# Patient Record
Sex: Male | Born: 1955
Health system: Southern US, Community
[De-identification: ages and names within clinical notes are randomized; demographics above are authoritative.]

## PROBLEM LIST (undated history)

## (undated) DIAGNOSIS — Z85820 Personal history of malignant melanoma of skin: Secondary | ICD-10-CM

## (undated) DIAGNOSIS — C61 Malignant neoplasm of prostate: Secondary | ICD-10-CM

## (undated) DIAGNOSIS — F5104 Psychophysiologic insomnia: Secondary | ICD-10-CM

## (undated) DIAGNOSIS — K219 Gastro-esophageal reflux disease without esophagitis: Secondary | ICD-10-CM

## (undated) DIAGNOSIS — T884XXA Failed or difficult intubation, initial encounter: Secondary | ICD-10-CM

## (undated) DIAGNOSIS — M1990 Unspecified osteoarthritis, unspecified site: Secondary | ICD-10-CM

## (undated) DIAGNOSIS — E7800 Pure hypercholesterolemia, unspecified: Secondary | ICD-10-CM

## (undated) HISTORY — PX: TONSILLECTOMY: SUR1361

## (undated) HISTORY — DX: Malignant neoplasm of prostate: C61

## (undated) HISTORY — PX: EXCISION MASS LOWER EXTREMETIES: SHX6705

---

## 2005-01-07 ENCOUNTER — Ambulatory Visit: Payer: Self-pay | Admitting: Vascular Surgery

## 2009-05-16 ENCOUNTER — Ambulatory Visit: Payer: Self-pay | Admitting: Gastroenterology

## 2014-08-14 DIAGNOSIS — F5104 Psychophysiologic insomnia: Secondary | ICD-10-CM | POA: Insufficient documentation

## 2014-08-14 DIAGNOSIS — K219 Gastro-esophageal reflux disease without esophagitis: Secondary | ICD-10-CM | POA: Insufficient documentation

## 2014-12-12 DIAGNOSIS — E7800 Pure hypercholesterolemia, unspecified: Secondary | ICD-10-CM | POA: Insufficient documentation

## 2016-01-16 ENCOUNTER — Other Ambulatory Visit: Payer: Self-pay | Admitting: Gastroenterology

## 2016-01-16 DIAGNOSIS — R1310 Dysphagia, unspecified: Secondary | ICD-10-CM

## 2016-02-06 ENCOUNTER — Ambulatory Visit
Admission: RE | Admit: 2016-02-06 | Discharge: 2016-02-06 | Disposition: A | Discharge status: Home / Self Care | Payer: 59 | Source: Ambulatory Visit | Attending: Gastroenterology | Admitting: Gastroenterology

## 2016-02-06 DIAGNOSIS — R1310 Dysphagia, unspecified: Secondary | ICD-10-CM | POA: Insufficient documentation

## 2016-02-06 DIAGNOSIS — K224 Dyskinesia of esophagus: Secondary | ICD-10-CM | POA: Insufficient documentation

## 2016-04-10 ENCOUNTER — Encounter: Payer: Self-pay | Admitting: *Deleted

## 2016-04-13 ENCOUNTER — Encounter
Admission: RE | Disposition: A | Discharge status: Home / Self Care | Payer: Self-pay | Source: Ambulatory Visit | Attending: Gastroenterology

## 2016-04-13 ENCOUNTER — Ambulatory Visit: Payer: 59 | Admitting: Anesthesiology

## 2016-04-13 ENCOUNTER — Ambulatory Visit
Admission: RE | Admit: 2016-04-13 | Discharge: 2016-04-13 | Disposition: A | Discharge status: Home / Self Care | Payer: 59 | Source: Ambulatory Visit | Attending: Gastroenterology | Admitting: Gastroenterology

## 2016-04-13 ENCOUNTER — Encounter: Payer: Self-pay | Admitting: *Deleted

## 2016-04-13 DIAGNOSIS — K224 Dyskinesia of esophagus: Secondary | ICD-10-CM | POA: Diagnosis not present

## 2016-04-13 DIAGNOSIS — Z1211 Encounter for screening for malignant neoplasm of colon: Secondary | ICD-10-CM | POA: Insufficient documentation

## 2016-04-13 DIAGNOSIS — Z880 Allergy status to penicillin: Secondary | ICD-10-CM | POA: Diagnosis not present

## 2016-04-13 DIAGNOSIS — Z79899 Other long term (current) drug therapy: Secondary | ICD-10-CM | POA: Insufficient documentation

## 2016-04-13 DIAGNOSIS — D123 Benign neoplasm of transverse colon: Secondary | ICD-10-CM | POA: Diagnosis not present

## 2016-04-13 DIAGNOSIS — K210 Gastro-esophageal reflux disease with esophagitis: Secondary | ICD-10-CM | POA: Diagnosis not present

## 2016-04-13 DIAGNOSIS — E7800 Pure hypercholesterolemia, unspecified: Secondary | ICD-10-CM | POA: Insufficient documentation

## 2016-04-13 DIAGNOSIS — Z881 Allergy status to other antibiotic agents status: Secondary | ICD-10-CM | POA: Diagnosis not present

## 2016-04-13 DIAGNOSIS — D125 Benign neoplasm of sigmoid colon: Secondary | ICD-10-CM | POA: Diagnosis not present

## 2016-04-13 DIAGNOSIS — Z85820 Personal history of malignant melanoma of skin: Secondary | ICD-10-CM | POA: Diagnosis not present

## 2016-04-13 DIAGNOSIS — Z86010 Personal history of colonic polyps: Secondary | ICD-10-CM | POA: Insufficient documentation

## 2016-04-13 DIAGNOSIS — K648 Other hemorrhoids: Secondary | ICD-10-CM | POA: Diagnosis not present

## 2016-04-13 DIAGNOSIS — K2950 Unspecified chronic gastritis without bleeding: Secondary | ICD-10-CM | POA: Insufficient documentation

## 2016-04-13 HISTORY — DX: Gastro-esophageal reflux disease without esophagitis: K21.9

## 2016-04-13 HISTORY — PX: COLONOSCOPY WITH PROPOFOL: SHX5780

## 2016-04-13 HISTORY — DX: Pure hypercholesterolemia, unspecified: E78.00

## 2016-04-13 HISTORY — DX: Psychophysiologic insomnia: F51.04

## 2016-04-13 HISTORY — DX: Personal history of malignant melanoma of skin: Z85.820

## 2016-04-13 HISTORY — PX: ESOPHAGOGASTRODUODENOSCOPY (EGD) WITH PROPOFOL: SHX5813

## 2016-04-13 SURGERY — ESOPHAGOGASTRODUODENOSCOPY (EGD) WITH PROPOFOL
Anesthesia: General

## 2016-04-13 MED ORDER — FENTANYL CITRATE (PF) 100 MCG/2ML IJ SOLN
INTRAMUSCULAR | Status: DC | PRN
  Administered 2016-04-13: 50 ug via INTRAVENOUS

## 2016-04-13 MED ORDER — PHENYLEPHRINE HCL 10 MG/ML IJ SOLN
INTRAMUSCULAR | Status: DC | PRN
  Administered 2016-04-13: 100 ug via INTRAVENOUS

## 2016-04-13 MED ORDER — PROPOFOL 500 MG/50ML IV EMUL
INTRAVENOUS | Status: DC | PRN
  Administered 2016-04-13 (×2): 150 ug/kg/min via INTRAVENOUS

## 2016-04-13 MED ORDER — MIDAZOLAM HCL 2 MG/2ML IJ SOLN
INTRAMUSCULAR | Status: DC | PRN
  Administered 2016-04-13: 1 mg via INTRAVENOUS

## 2016-04-13 MED ORDER — PROPOFOL 10 MG/ML IV BOLUS
INTRAVENOUS | Status: DC | PRN
  Administered 2016-04-13: 50 mg via INTRAVENOUS
  Administered 2016-04-13: 40 mg via INTRAVENOUS

## 2016-04-13 MED ORDER — SODIUM CHLORIDE 0.9 % IV SOLN: INTRAVENOUS | Status: DC

## 2016-04-13 MED ORDER — SODIUM CHLORIDE 0.9 % IV SOLN
INTRAVENOUS | Status: DC
  Administered 2016-04-13 (×2): via INTRAVENOUS

## 2016-04-13 NOTE — Anesthesia Procedure Notes (Signed)
Date/Time: 04/13/2016 3:54 PM Performed by: Nelda Marseille Pre-anesthesia Checklist: Patient identified, Emergency Drugs available, Suction available, Patient being monitored and Timeout performed

## 2016-04-13 NOTE — Anesthesia Preprocedure Evaluation (Signed)
Anesthesia Evaluation   Patient awake    Reviewed: Allergy & Precautions, NPO status , Patient's Chart, lab work & pertinent test results  History of Anesthesia Complications Negative for: history of anesthetic complications  Airway Mallampati: II  TM Distance: >3 FB Neck ROM: Full    Dental no notable dental hx.    Pulmonary neg pulmonary ROS, neg sleep apnea, neg COPD,    breath sounds clear to auscultation- rhonchi (-) wheezing      Cardiovascular Exercise Tolerance: Good (-) hypertension(-) CAD and (-) Past MI  Rhythm:Regular Rate:Normal - Systolic murmurs and - Diastolic murmurs    Neuro/Psych negative neurological ROS  negative psych ROS   GI/Hepatic Neg liver ROS, GERD  ,  Endo/Other  negative endocrine ROSneg diabetes  Renal/GU negative Renal ROS     Musculoskeletal negative musculoskeletal ROS (+)   Abdominal (+) - obese,   Peds  Hematology negative hematology ROS (+)   Anesthesia Other Findings Past Medical History: No date: Chronic insomnia No date: GERD (gastroesophageal reflux disease) No date: H/O Malignant melanoma     Comment: LEFT LEG No date: Hypercholesteremia   Reproductive/Obstetrics                             Anesthesia Physical Anesthesia Plan  ASA: II  Anesthesia Plan: General   Post-op Pain Management:    Induction: Intravenous  Airway Management Planned: Natural Airway  Additional Equipment:   Intra-op Plan:   Post-operative Plan:   Informed Consent: I have reviewed the patients History and Physical, chart, labs and discussed the procedure including the risks, benefits and alternatives for the proposed anesthesia with the patient or authorized representative who has indicated his/her understanding and acceptance.   Dental advisory given  Plan Discussed with: CRNA and Anesthesiologist  Anesthesia Plan Comments:         Anesthesia  Quick Evaluation

## 2016-04-13 NOTE — Op Note (Signed)
[facility] Gastroenterology Patient Name: David Kirby Procedure Date: 04/13/2016 3:09 PM MRN: [id number] Account #: [id number] Date of Birth: Dec 01, 1955 Admit Type: Outpatient Age: 60 Room: [facility] ENDO ROOM 3 Gender: Male Note Status: Finalized Procedure:            Upper GI endoscopy Indications:          Dysphagia Providers:            Lollie Sails, MD Referring MD:         Christena Flake. Raechel Ache, MD (Referring MD) Medicines:            Monitored Anesthesia Care Complications:        No immediate complications. Procedure:            Pre-Anesthesia Assessment:                       - ASA Grade Assessment: II - A patient with mild                        systemic disease.                       After obtaining informed consent, the endoscope was                        passed under direct vision. Throughout the procedure,                        the patient's blood pressure, pulse, and oxygen                        saturations were monitored continuously. The Endoscope                        was introduced through the mouth, and advanced to the                        third part of duodenum. The upper GI endoscopy was                        accomplished without difficulty. The patient tolerated                        the procedure well. Findings:      Abnormal motility was noted in the esophagus. The cricopharyngeus was       normal. There is intermittant ringed "feline" appearance. of the       esophageal body. Tertiary peristaltic waves are noted. Biopsies were       taken from about 30 cm from the incisors.      The Z-line was variable. Biopsies were taken with a cold forceps for       histology.      The exam of the esophagus was otherwise normal.      Patchy mild inflammation characterized by congestion (edema), erythema       and granularity was found in the gastric antrum. Biopsies were taken       with a cold forceps for histology.      Patchy minimal  inflammation characterized by erythema was found in the       gastric body.      The examined  duodenum was normal.      The cardia and gastric fundus were normal on retroflexion. Impression:           - Abnormal esophageal motility.                       - Z-line variable. Biopsied.                       - Gastritis. Biopsied.                       - Gastritis.                       - Normal examined duodenum. Recommendation:       - Await pathology results.                       - Use Prilosec (omeprazole) 20 mg PO daily daily. Procedure Code(s):    --- Professional ---                       [phone], Esophagogastroduodenoscopy, flexible, transoral;                        with biopsy, single or multiple Diagnosis Code(s):    --- Professional ---                       K22.4, Dyskinesia of esophagus                       K22.8, Other specified diseases of esophagus                       K29.70, Gastritis, unspecified, without bleeding                       R13.10, Dysphagia, unspecified CPT copyright 2016 American Medical Association. All rights reserved. The codes documented in this report are preliminary and upon coder review may  be revised to meet current compliance requirements. Lollie Sails, MD 04/13/2016 3:35:14 PM This report has been signed electronically. Number of Addenda: 0 Note Initiated On: 04/13/2016 3:09 PM      [facility]

## 2016-04-13 NOTE — Transfer of Care (Signed)
Immediate Anesthesia Transfer of Care Note  Patient: David Kirby  Procedure(s) Performed: Procedure(s): ESOPHAGOGASTRODUODENOSCOPY (EGD) WITH PROPOFOL (N/A) COLONOSCOPY WITH PROPOFOL (N/A)  Patient Location: PACU  Anesthesia Type:General  Level of Consciousness: sedated  Airway & Oxygen Therapy: Patient Spontanous Breathing and Patient connected to nasal cannula oxygen  Post-op Assessment: Report given to RN and Post -op Vital signs reviewed and stable  Post vital signs: Reviewed and stable  Last Vitals:  Vitals:   04/13/16 1323  BP: (!) 144/82  Pulse: 65  Resp: 16  Temp: 36.7 C    Last Pain:  Vitals:   04/13/16 1323  TempSrc: Tympanic         Complications: No apparent anesthesia complications

## 2016-04-13 NOTE — Op Note (Signed)
[facility] Gastroenterology Patient Name: David Kirby Procedure Date: 04/13/2016 3:08 PM MRN: [id number] Account #: [id number] Date of Birth: 04-29-1956 Admit Type: Outpatient Age: 60 Room: [facility] ENDO ROOM 3 Gender: Male Note Status: Finalized Procedure:            Colonoscopy Indications:          Personal history of colonic polyps Providers:            Lollie Sails, MD Referring MD:         Christena Flake. Raechel Ache, MD (Referring MD) Medicines:            Monitored Anesthesia Care Complications:        No immediate complications. Procedure:            Pre-Anesthesia Assessment:                       - ASA Grade Assessment: II - A patient with mild                        systemic disease.                       After obtaining informed consent, the colonoscope was                        passed under direct vision. Throughout the procedure,                        the patient's blood pressure, pulse, and oxygen                        saturations were monitored continuously. The                        Colonoscope was introduced through the anus and                        advanced to the the cecum, identified by appendiceal                        orifice and ileocecal valve. The colonoscopy was                        unusually difficult due to significant looping and a                        tortuous colon. Successful completion of the procedure                        was aided by changing the patient to a supine position,                        changing the patient to a prone position and using                        manual pressure. The quality of the bowel preparation                        was fair. Findings:      A single non-bleeding erosion was found in the sigmoid  colon. No       stigmata of recent bleeding were seen. Biopsies were taken with a cold       forceps for histology.      A 4 mm polyp was found in the splenic flexure. The polyp was sessile.   The polyp was removed with a cold snare. Resection and retrieval were       complete.      A 8 mm polyp was found in the hepatic flexure. The polyp was sessile.       The polyp was removed with a hot snare. Resection and retrieval were       complete.      A 3 mm polyp was found in the proximal transverse colon. The polyp was       sessile. The polyp was removed with a cold biopsy forceps. Resection and       retrieval were complete.      A localized area of granular mucosa was found in the cecumacross from       the IC valve. Biopsies were taken with a cold forceps for histology.      A 2 mm polyp was found in the proximal sigmoid colon. The polyp was       sessile. The polyp was removed with a cold biopsy forceps. Resection and       retrieval were complete.      A 4 mm polyp was found in the distal sigmoid colon. The polyp was       pedunculated. The polyp was removed with a hot snare. Resection and       retrieval were complete.      The digital rectal exam was normal. Impression:           - Preparation of the colon was fair.                       - A single erosion in the sigmoid colon. Biopsied.                       - One 4 mm polyp at the splenic flexure, removed with a                        cold snare. Resected and retrieved.                       - One 8 mm polyp at the hepatic flexure, removed with a                        hot snare. Resected and retrieved.                       - One 3 mm polyp in the proximal transverse colon,                        removed with a cold biopsy forceps. Resected and                        retrieved.                       - Granularity in the cecum. Biopsied.                       -  One 2 mm polyp in the proximal sigmoid colon, removed                        with a cold biopsy forceps. Resected and retrieved.                       - One 4 mm polyp in the distal sigmoid colon, removed                        with a hot snare. Resected and  retrieved. Recommendation:       - Discharge patient to home.                       - Await pathology results. Procedure Code(s):    --- Professional ---                       [phone], Colonoscopy, flexible; with removal of tumor(s),                        polyp(s), or other lesion(s) by snare technique                       [id number], 44, Colonoscopy, flexible; with biopsy, single                        or multiple Diagnosis Code(s):    --- Professional ---                       K63.3, Ulcer of intestine                       D12.3, Benign neoplasm of transverse colon (hepatic                        flexure or splenic flexure)                       D12.5, Benign neoplasm of sigmoid colon                       K63.89, Other specified diseases of intestine                       Z86.010, Personal history of colonic polyps CPT copyright 2016 American Medical Association. All rights reserved. The codes documented in this report are preliminary and upon coder review may  be revised to meet current compliance requirements. Lollie Sails, MD 04/13/2016 4:30:32 PM This report has been signed electronically. Number of Addenda: 0 Note Initiated On: 04/13/2016 3:08 PM Scope Withdrawal Time: 0 hours 13 minutes 34 seconds  Total Procedure Duration: 0 hours 42 minutes 28 seconds       [facility]

## 2016-04-13 NOTE — H&P (Signed)
Outpatient short stay form Pre-procedure 04/13/2016 2:53 PM Lollie Sails MD  Primary Physician: Dr. Genene Churn  Reason for visit:  EGD and colonoscopy  History of present illness:  Patient is a 60 year old male presenting today as above. Has a personal history of adenomatous colon polyps. Also he states that he is been having some reflux type symptoms. He has no actual dysphagia. He did have a barium swallow which showed a normal transit of the 13 mm tablet. There were some intermittent distal esophageal spasm otherwise normal findings. He tolerated his prep well. He takes no aspirin or blood thinning agents.  He states he started on some omeprazole daily and this seems to have helped much of his reflux issue.    Current Facility-Administered Medications:  .  0.9 %  sodium chloride infusion, , Intravenous, Continuous, Lollie Sails, MD, Last Rate: 20 mL/hr at 04/13/16 1336 .  0.9 %  sodium chloride infusion, , Intravenous, Continuous, Lollie Sails, MD  Prescriptions Prior to Admission  Medication Sig Dispense Refill Last Dose  . cetirizine (ZYRTEC) 10 MG tablet Take 10 mg by mouth daily.   Past Week at Unknown time  . ibuprofen (ADVIL,MOTRIN) 200 MG tablet Take 200 mg by mouth 2 (two) times daily.   Past Week at Unknown time  . Melatonin 3 MG TABS Take 3 mg by mouth at bedtime as needed.   Past Week at Unknown time  . Multiple Vitamin (MULTIVITAMIN) tablet Take 1 tablet by mouth daily.   Past Week at Unknown time  . omeprazole (PRILOSEC) 20 MG capsule Take 20 mg by mouth daily.   Past Week at Unknown time  . polyethylene glycol powder (GLYCOLAX/MIRALAX) powder Take 1 Container by mouth once.   Completed Course at Unknown time  . ranitidine (ZANTAC) 75 MG tablet Take 75 mg by mouth 2 (two) times daily.   Completed Course at Unknown time     Allergies  Allergen Reactions  . Biaxin [Clarithromycin] Other (See Comments)    G.I.UPSET   . Penicillins Other (See Comments)    G.I.UPSET   . Tetracyclines & Related Swelling     Past Medical History:  Diagnosis Date  . Chronic insomnia   . GERD (gastroesophageal reflux disease)   . H/O Malignant melanoma    LEFT LEG  . Hypercholesteremia     Review of systems:      Physical Exam    Heart and lungs: Regular rate and rhythm without rub or gallop, lungs are bilaterally clear.    HEENT: Normocephalic atraumatic eyes are anicteric    Other:     Pertinant exam for procedure: Soft nontender nondistended bowel sounds positive normoactive.    Planned proceedures: EGD, colonoscopy and indicated procedures. I have discussed the risks benefits and complications of procedures to include not limited to bleeding, infection, perforation and the risk of sedation and the patient wishes to proceed.    Lollie Sails, MD Gastroenterology 04/13/2016  2:53 PM

## 2016-04-14 ENCOUNTER — Encounter: Payer: Self-pay | Admitting: Gastroenterology

## 2016-04-14 NOTE — Anesthesia Postprocedure Evaluation (Signed)
Anesthesia Post Note  Patient: David Kirby  Procedure(s) Performed: Procedure(s) (LRB): ESOPHAGOGASTRODUODENOSCOPY (EGD) WITH PROPOFOL (N/A) COLONOSCOPY WITH PROPOFOL (N/A)  Patient location during evaluation: Endoscopy Anesthesia Type: General Level of consciousness: awake and alert Pain management: pain level controlled Vital Signs Assessment: post-procedure vital signs reviewed and stable Respiratory status: spontaneous breathing, nonlabored ventilation, respiratory function stable and patient connected to nasal cannula oxygen Cardiovascular status: blood pressure returned to baseline and stable Postop Assessment: no signs of nausea or vomiting Anesthetic complications: no    Last Vitals:  Vitals:   04/13/16 1648 04/13/16 1658  BP: 123/77 140/80  Pulse: 62 (!) 58  Resp: 13 14  Temp:      Last Pain:  Vitals:   04/13/16 1628  TempSrc: Axillary  PainSc: Asleep                 Martha Clan

## 2016-04-15 LAB — SURGICAL PATHOLOGY

## 2016-12-17 DIAGNOSIS — Z0000 Encounter for general adult medical examination without abnormal findings: Secondary | ICD-10-CM | POA: Diagnosis not present

## 2016-12-17 DIAGNOSIS — I8393 Asymptomatic varicose veins of bilateral lower extremities: Secondary | ICD-10-CM | POA: Insufficient documentation

## 2016-12-17 DIAGNOSIS — Z86010 Personal history of colonic polyps: Secondary | ICD-10-CM | POA: Insufficient documentation

## 2016-12-17 DIAGNOSIS — E7800 Pure hypercholesterolemia, unspecified: Secondary | ICD-10-CM | POA: Diagnosis not present

## 2017-04-30 DIAGNOSIS — Z23 Encounter for immunization: Secondary | ICD-10-CM | POA: Diagnosis not present

## 2017-12-21 DIAGNOSIS — I83893 Varicose veins of bilateral lower extremities with other complications: Secondary | ICD-10-CM | POA: Diagnosis not present

## 2017-12-21 DIAGNOSIS — Z79899 Other long term (current) drug therapy: Secondary | ICD-10-CM | POA: Diagnosis not present

## 2017-12-21 DIAGNOSIS — R030 Elevated blood-pressure reading, without diagnosis of hypertension: Secondary | ICD-10-CM | POA: Diagnosis not present

## 2017-12-21 DIAGNOSIS — Z0000 Encounter for general adult medical examination without abnormal findings: Secondary | ICD-10-CM | POA: Diagnosis not present

## 2017-12-21 DIAGNOSIS — E7800 Pure hypercholesterolemia, unspecified: Secondary | ICD-10-CM | POA: Diagnosis not present

## 2017-12-21 DIAGNOSIS — Z125 Encounter for screening for malignant neoplasm of prostate: Secondary | ICD-10-CM | POA: Diagnosis not present

## 2017-12-21 DIAGNOSIS — R238 Other skin changes: Secondary | ICD-10-CM | POA: Diagnosis not present

## 2017-12-22 DIAGNOSIS — R972 Elevated prostate specific antigen [PSA]: Secondary | ICD-10-CM | POA: Insufficient documentation

## 2018-02-11 ENCOUNTER — Ambulatory Visit (INDEPENDENT_AMBULATORY_CARE_PROVIDER_SITE_OTHER): Payer: 59 | Admitting: Urology

## 2018-02-11 ENCOUNTER — Encounter: Payer: Self-pay | Admitting: Urology

## 2018-02-11 ENCOUNTER — Other Ambulatory Visit
Admission: RE | Admit: 2018-02-11 | Discharge: 2018-02-11 | Disposition: A | Discharge status: Home / Self Care | Payer: 59 | Source: Ambulatory Visit | Attending: Urology | Admitting: Urology

## 2018-02-11 VITALS — BP 156/86 | HR 62 | Ht 69.0 in | Wt 190.0 lb

## 2018-02-11 DIAGNOSIS — N400 Enlarged prostate without lower urinary tract symptoms: Secondary | ICD-10-CM

## 2018-02-11 DIAGNOSIS — R972 Elevated prostate specific antigen [PSA]: Secondary | ICD-10-CM | POA: Insufficient documentation

## 2018-02-11 NOTE — Progress Notes (Signed)
02/11/2018 2:56 PM   David Kirby Jul 05, 1956 [id number]  Referring provider: [name], MD [facility] [address], [place] [address]  Chief Complaint  Patient presents with  . Elevated PSA    New Patient    HPI: 62 year old male referred for evaluation of elevated PSA.  He underwent routine annual PSA screening by his primary care physician on 12/21/2017 at which time his PSA was noted to be 5.48.  This is up significantly from 2 years ago at which time his PSA was 2.82.  Most recent urinalysis on 12/21/2017 negative.     He denies any urinary symptoms including frequency, urgency, nocturia, weak stream, incomplete bladder emptying, hematuria, or dysuria.  He is completely symptom medic.  He does report that he was told by his previous primary care physician that he has an enlarged prostate.  He is never seen a urologist before.  He takes no BPH meds.  No family history of prostate cancer.  No weight loss or bone pain.    PMH: Past Medical History:  Diagnosis Date  . Chronic insomnia   . GERD (gastroesophageal reflux disease)   . H/O Malignant melanoma    LEFT LEG  . Hypercholesteremia     Surgical History: Past Surgical History:  Procedure Laterality Date  . COLONOSCOPY WITH PROPOFOL N/A 04/13/2016   Procedure: COLONOSCOPY WITH PROPOFOL;  Surgeon: Lollie Sails, MD;  [facility] ENDOSCOPY;  Service: Endoscopy;  Laterality: N/A;  . ESOPHAGOGASTRODUODENOSCOPY (EGD) WITH PROPOFOL N/A 04/13/2016   Procedure: ESOPHAGOGASTRODUODENOSCOPY (EGD) WITH PROPOFOL;  Surgeon: Lollie Sails, MD;  Location: [facility] ENDOSCOPY;  Service: Endoscopy;  Laterality: N/A;  . EXCISION MASS LOWER EXTREMETIES Left    EXCISION MALIGNANT MELANOMA  . TONSILLECTOMY     W/ADENOIDECTOMY    Home Medications:  Allergies as of 02/11/2018      Reactions   Biaxin [clarithromycin] Other (See Comments)   G.I.UPSET   Penicillins Other (See Comments)   G.I.UPSET   Tetracyclines & Related Swelling      Medication List        Accurate as of 02/11/18  2:56 PM. Always use your most recent med list.          Melatonin 1 MG Caps Take by mouth.   omeprazole 20 MG capsule Commonly known as:  PRILOSEC Take 20 mg by mouth daily.       Allergies:  Allergies  Allergen Reactions  . Biaxin [Clarithromycin] Other (See Comments)    G.I.UPSET   . Penicillins Other (See Comments)    G.I.UPSET   . Tetracyclines & Related Swelling    Family History: No family history on file.  Social History:  reports that he has never smoked. He has never used smokeless tobacco. He reports that he drinks about 1.2 oz of alcohol per week. He reports that he does not use drugs.  ROS: UROLOGY Frequent Urination?: No Hard to postpone urination?: No Burning/pain with urination?: No Get up at night to urinate?: Yes Leakage of urine?: No Urine stream starts and stops?: No Trouble starting stream?: No Do you have to strain to urinate?: No Blood in urine?: No Urinary tract infection?: No Sexually transmitted disease?: No Injury to kidneys or bladder?: No Painful intercourse?: No Weak stream?: No Erection problems?: No Penile pain?: No  Gastrointestinal Nausea?: No Vomiting?: No Indigestion/heartburn?: Yes Diarrhea?: No Constipation?: No  Constitutional Fever: No Night sweats?: No Weight loss?: No Fatigue?: No  Skin Skin rash/lesions?: No Itching?: No  Eyes Blurred vision?: No Double vision?: No  Ears/Nose/Throat Sore throat?: No Sinus problems?: Yes  Hematologic/Lymphatic Swollen glands?: No Easy bruising?: Yes  Cardiovascular Leg swelling?: Yes Chest pain?: No  Respiratory Cough?: No Shortness of breath?: No  Endocrine Excessive thirst?: No  Musculoskeletal Back pain?: No Joint pain?: No  Neurological Headaches?: No Dizziness?: No  Psychologic Depression?: No Anxiety?: No  Physical Exam: BP (!) 156/86   Pulse  62   Ht 5\' 9"  (1.753 m)   Wt 190 lb (86.2 kg)   BMI 28.06 kg/m   Constitutional:  Alert and oriented, No acute distress. HEENT: [place] AT, moist mucus membranes.  Trachea midline, no masses. Cardiovascular: No clubbing, cyanosis, or edema. Respiratory: Normal respiratory effort, no increased work of breathing. GI: Abdomen is soft, nontender, nondistended, no abdominal masses GU: No CVA tenderness Rectal: Normal sphincter tone, 50+ cc prostate, non tender, no nodule Skin: + bilateral varicose veins notable on feet Neurologic: Grossly intact, no focal deficits, moving all 4 extremities. Psychiatric: Normal mood and affect.  Laboratory Data: Labs from 12/21/2017 reviewed Hemoglobin 15.4 Creatinine 0.8  Urinalysis UA reviewed, negative  Pertinent Imaging: N/A   Assessment & Plan:    1. Elevated PSA  We reviewed the implications of an elevated PSA and the uncertainty surrounding it. In general, a man's PSA increases with age and is produced by both normal and cancerous prostate tissue. Differential for elevated PSA is BPH, prostate cancer, infection, recent intercourse/ejaculation, prostate infarction, recent urethroscopic manipulation (foley placement/cystoscopy) and prostatitis. Management of an elevated PSA can include observation or prostate biopsy and wediscussed this in detail.  We discussed that indications for prostate biopsy are defined by age and race specific PSA cutoffs as well as a PSA velocity of 0.75/year.  I recommended repeat PSA today.  If his PSA is down, will likely follow him closely.  Otherwise, we did go ahead and discuss prostate biopsy.  We discussed prostate biopsy in detail including the procedure itself, the risks of blood in the urine, stool, and ejaculate, serious infection, and discomfort. He is willing to proceed with this as discussed.  Call him with his PSA results as well as follow-up recommendations based on this repeat value.  He is agreeable this  plan.  2.  BPH without lower urinary tract symptoms Prostamegaly on exam but otherwise asymptomatic  - PSA; Future  Hollice Espy, MD  [facility] [address], [place], [place] [address] [phone]

## 2018-02-12 LAB — PSA: PROSTATIC SPECIFIC ANTIGEN: 5.01 ng/mL — AB (ref 0.00–4.00)

## 2018-02-14 ENCOUNTER — Telehealth: Payer: Self-pay

## 2018-02-14 NOTE — Telephone Encounter (Signed)
-----   Message from Hollice Espy, MD sent at 02/14/2018  9:19 AM EDT ----- PSA is still up to 5.01 which is higher than would be expected for his age, however the downward trend is somewhat reassuring.  Like to recheck his PSA in 4 months (PSA followed by visit) and keep a close eye on it.  If remains elevated, will consider biopsy vs. MRI.  Hollice Espy, MD

## 2018-02-14 NOTE — Telephone Encounter (Signed)
Message sent via mychart. Please scheudle appointment for psa and follow-up in 4 months.

## 2018-02-15 ENCOUNTER — Encounter (INDEPENDENT_AMBULATORY_CARE_PROVIDER_SITE_OTHER): Payer: Self-pay

## 2018-02-15 NOTE — Telephone Encounter (Signed)
apps made and patient was notified via mychart.  Sharyn Lull

## 2018-05-01 DIAGNOSIS — Z23 Encounter for immunization: Secondary | ICD-10-CM | POA: Diagnosis not present

## 2018-06-02 DIAGNOSIS — L918 Other hypertrophic disorders of the skin: Secondary | ICD-10-CM | POA: Diagnosis not present

## 2018-06-13 ENCOUNTER — Other Ambulatory Visit: Payer: Self-pay

## 2018-06-13 DIAGNOSIS — N400 Enlarged prostate without lower urinary tract symptoms: Secondary | ICD-10-CM

## 2018-06-13 DIAGNOSIS — R972 Elevated prostate specific antigen [PSA]: Secondary | ICD-10-CM

## 2018-06-15 ENCOUNTER — Other Ambulatory Visit: Payer: 59

## 2018-06-15 DIAGNOSIS — R972 Elevated prostate specific antigen [PSA]: Secondary | ICD-10-CM

## 2018-06-15 DIAGNOSIS — N400 Enlarged prostate without lower urinary tract symptoms: Secondary | ICD-10-CM

## 2018-06-16 LAB — PSA: PROSTATE SPECIFIC AG, SERUM: 4.4 ng/mL — AB (ref 0.0–4.0)

## 2018-06-21 NOTE — Progress Notes (Signed)
06/22/2018  10:46 AM   David Kirby [birth year] [id number]  Referring provider: [name], MD [facility] [place], [place] [address]  Chief Complaint  Patient presents with  . Elevated PSA    4 month    HPI: David Kirby is a 62 yo M who returns today for the evaluation and management of elevated PSA and BPH w/o LUTS. His last visit with [place] was on 02/11/18.   He denies any urinary symptoms including frequency, urgency, nocturia, weak stream, incomplete bladder emptying, hematuria, or dysuria.  He is completely symptom medic.  PSA trend below.   Background History:  He reported that he was told by his previous primary care physician that he had an enlarged prostate.  He had never seen a urologist before.  He reported of not being BPH meds. No family history of prostate cancer. No weight loss or bone pain.  PSA Trend 06/15/18, 4.4  02/11/18, 5.01 12/21/2017, 5.48  12/2015, 2.82   PMH: Past Medical History:  Diagnosis Date  . Chronic insomnia   . GERD (gastroesophageal reflux disease)   . H/O Malignant melanoma    LEFT LEG  . Hypercholesteremia     Surgical History: Past Surgical History:  Procedure Laterality Date  . COLONOSCOPY WITH PROPOFOL N/A 04/13/2016   Procedure: COLONOSCOPY WITH PROPOFOL;  Surgeon: Lollie Sails, MD;  Location: [facility] ENDOSCOPY;  Service: Endoscopy;  Laterality: N/A;  . ESOPHAGOGASTRODUODENOSCOPY (EGD) WITH PROPOFOL N/A 04/13/2016   Procedure: ESOPHAGOGASTRODUODENOSCOPY (EGD) WITH PROPOFOL;  Surgeon: Lollie Sails, MD;  Location: [facility] ENDOSCOPY;  Service: Endoscopy;  Laterality: N/A;  . EXCISION MASS LOWER EXTREMETIES Left    EXCISION MALIGNANT MELANOMA  . TONSILLECTOMY     W/ADENOIDECTOMY    Home Medications:  Allergies as of 06/22/2018      Reactions   Biaxin [clarithromycin] Other (See Comments)   G.I.UPSET   Penicillins Other (See Comments)   G.I.UPSET   Tetracyclines & Related Swelling        Medication List        Accurate as of 06/22/18 10:46 AM. Always use your most recent med list.          Melatonin 1 MG Caps Take by mouth.   omeprazole 20 MG capsule Commonly known as:  PRILOSEC Take 20 mg by mouth daily.       Allergies:  Allergies  Allergen Reactions  . Biaxin [Clarithromycin] Other (See Comments)    G.I.UPSET   . Penicillins Other (See Comments)    G.I.UPSET   . Tetracyclines & Related Swelling    Family History: No family history on file.  Social History:  reports that he has never smoked. He has never used smokeless tobacco. He reports that he drinks about 2.0 standard drinks of alcohol per week. He reports that he does not use drugs.  ROS: UROLOGY Frequent Urination?: No Hard to postpone urination?: No Burning/pain with urination?: No Get up at night to urinate?: No Leakage of urine?: No Urine stream starts and stops?: No Trouble starting stream?: No Do you have to strain to urinate?: No Blood in urine?: No Urinary tract infection?: No Sexually transmitted disease?: No Injury to kidneys or bladder?: No Painful intercourse?: No Weak stream?: No Erection problems?: No Penile pain?: No  Gastrointestinal Nausea?: No Vomiting?: No Indigestion/heartburn?: Yes Diarrhea?: No Constipation?: No  Constitutional Fever: No Night sweats?: No Weight loss?: No Fatigue?: No  Skin Skin rash/lesions?: No Itching?: No  Eyes Blurred vision?:  No Double vision?: No  Ears/Nose/Throat Sore throat?: No Sinus problems?: No  Hematologic/Lymphatic Swollen glands?: No Easy bruising?: No  Cardiovascular Leg swelling?: No Chest pain?: No  Respiratory Cough?: No Shortness of breath?: No  Endocrine Excessive thirst?: No  Musculoskeletal Back pain?: No Joint pain?: No  Neurological Headaches?: No Dizziness?: No  Psychologic Depression?: No Anxiety?: No  Physical Exam: BP (!) 144/87   Pulse 69   Ht 5\' 9"  (1.753 m)   Wt  190 lb (86.2 kg)   BMI 28.06 kg/m   Constitutional:  Alert and oriented, No acute distress. HEENT: [place] AT, moist mucus membranes.  Trachea midline, no masses. Cardiovascular: No clubbing, cyanosis, or edema. Respiratory: Normal respiratory effort, no increased work of breathing. Skin: No rashes, bruises or suspicious lesions. Neurologic: Grossly intact, no focal deficits, moving all 4 extremities. Psychiatric: Normal mood and affect.  Laboratory Data:  PSA as above   Assessment & Plan:    1. Elevated PSA PSA trending downward but remains elevated We discussed options including continued close surveillance, biopsy vs. MRI. He is interested in continued close surveillance which seems reasonable at this point  Follow up in 4 months for lab only and then in 8 months w/ lab before and rectal exam  Low threshold to consider alternative options if PSA begins to trend back upwards  2.  BPH without lower urinary tract symptoms Prostamegaly on exam but otherwise asymptomatic   Return for f/u in 4 months for lab only, then in 8 months with lab before and rectal exam.  Hollice Espy, MD  [facility] [address] [place], [place] [address] [phone]   I, Lucas Mallow, am acting as a scribe for Dr. Hollice Espy,  I have reviewed the above documentation for accuracy and completeness, and I agree with the above.   Hollice Espy, MD

## 2018-06-22 ENCOUNTER — Encounter: Payer: Self-pay | Admitting: Urology

## 2018-06-22 ENCOUNTER — Ambulatory Visit (INDEPENDENT_AMBULATORY_CARE_PROVIDER_SITE_OTHER): Payer: 59 | Admitting: Urology

## 2018-06-22 VITALS — BP 144/87 | HR 69 | Ht 69.0 in | Wt 190.0 lb

## 2018-06-22 DIAGNOSIS — R972 Elevated prostate specific antigen [PSA]: Secondary | ICD-10-CM

## 2018-06-22 DIAGNOSIS — N400 Enlarged prostate without lower urinary tract symptoms: Secondary | ICD-10-CM

## 2018-10-27 ENCOUNTER — Other Ambulatory Visit: Payer: 59

## 2018-10-27 ENCOUNTER — Other Ambulatory Visit: Payer: Self-pay

## 2018-10-27 DIAGNOSIS — R972 Elevated prostate specific antigen [PSA]: Secondary | ICD-10-CM

## 2018-10-28 ENCOUNTER — Other Ambulatory Visit: Payer: 59

## 2018-10-28 LAB — PSA: Prostate Specific Ag, Serum: 4.3 ng/mL — ABNORMAL HIGH (ref 0.0–4.0)

## 2019-02-15 ENCOUNTER — Other Ambulatory Visit: Payer: 59

## 2019-02-24 ENCOUNTER — Ambulatory Visit: Payer: 59 | Admitting: Urology

## 2019-03-16 ENCOUNTER — Other Ambulatory Visit
Admission: RE | Admit: 2019-03-16 | Discharge: 2019-03-16 | Disposition: A | Discharge status: Home / Self Care | Payer: 59 | Attending: Urology | Admitting: Urology

## 2019-03-16 DIAGNOSIS — R972 Elevated prostate specific antigen [PSA]: Secondary | ICD-10-CM | POA: Insufficient documentation

## 2019-03-16 LAB — PSA: Prostatic Specific Antigen: 5.04 ng/mL — ABNORMAL HIGH (ref 0.00–4.00)

## 2019-03-24 ENCOUNTER — Ambulatory Visit: Payer: 59 | Admitting: Urology

## 2019-03-31 ENCOUNTER — Ambulatory Visit (INDEPENDENT_AMBULATORY_CARE_PROVIDER_SITE_OTHER): Payer: 59 | Admitting: Urology

## 2019-03-31 ENCOUNTER — Encounter: Payer: Self-pay | Admitting: Urology

## 2019-03-31 ENCOUNTER — Other Ambulatory Visit: Payer: Self-pay

## 2019-03-31 VITALS — BP 134/79 | HR 76 | Ht 69.0 in | Wt 180.0 lb

## 2019-03-31 DIAGNOSIS — R972 Elevated prostate specific antigen [PSA]: Secondary | ICD-10-CM

## 2019-03-31 DIAGNOSIS — N400 Enlarged prostate without lower urinary tract symptoms: Secondary | ICD-10-CM

## 2019-03-31 NOTE — Progress Notes (Signed)
03/31/2019 8:46 AM   David Kirby 1956/05/28 [id number]  Referring provider: [name], MD [facility] [place],  [place] [address]  Chief Complaint  Patient presents with  . Elevated PSA    90mo    HPI: 63 year old male returns today for follow-up for history of elevated PSA.  Most recently, his PSA is trending back upward to 5.07.  No previous biopsies or imaging.  No family history of prostate cancer.  Today, he has very few voiding complaints.  He gets up occasionally at night.  He feels like he empties his bladder well.  No UTIs or gross hematuria.  PSA Trend 03/16/2019, 5.04 10/27/2018, 4.3 06/15/18, 4.4  02/11/18, 5.01 12/21/2017, 5.48  12/2015, 2.82  PMH: Past Medical History:  Diagnosis Date  . Chronic insomnia   . GERD (gastroesophageal reflux disease)   . H/O Malignant melanoma    LEFT LEG  . Hypercholesteremia     Surgical History: Past Surgical History:  Procedure Laterality Date  . COLONOSCOPY WITH PROPOFOL N/A 04/13/2016   Procedure: COLONOSCOPY WITH PROPOFOL;  Surgeon: Lollie Sails, MD;  Location: [facility] ENDOSCOPY;  Service: Endoscopy;  Laterality: N/A;  . ESOPHAGOGASTRODUODENOSCOPY (EGD) WITH PROPOFOL N/A 04/13/2016   Procedure: ESOPHAGOGASTRODUODENOSCOPY (EGD) WITH PROPOFOL;  Surgeon: Lollie Sails, MD;  Location: [facility] ENDOSCOPY;  Service: Endoscopy;  Laterality: N/A;  . EXCISION MASS LOWER EXTREMETIES Left    EXCISION MALIGNANT MELANOMA  . TONSILLECTOMY     W/ADENOIDECTOMY    Home Medications:  Allergies as of 03/31/2019      Reactions   Biaxin [clarithromycin] Other (See Comments)   G.I.UPSET   Penicillins Other (See Comments)   G.I.UPSET   Tetracyclines & Related Swelling      Medication List       Accurate as of March 31, 2019 11:59 PM. If you have any questions, ask your nurse or doctor.        CENTRUM SILVER PO Take by mouth.   Melatonin 1 MG Caps Take by mouth.   omeprazole  20 MG capsule Commonly known as: PRILOSEC Take 20 mg by mouth daily.   senna-docusate 8.6-50 MG tablet Commonly known as: Senokot-S Take by mouth.       Allergies:  Allergies  Allergen Reactions  . Biaxin [Clarithromycin] Other (See Comments)    G.I.UPSET   . Penicillins Other (See Comments)    G.I.UPSET   . Tetracyclines & Related Swelling    Family History: No family history on file.  Social History:  reports that he has never smoked. He has never used smokeless tobacco. He reports current alcohol use of about 2.0 standard drinks of alcohol per week. He reports that he does not use drugs.  ROS: UROLOGY Frequent Urination?: No Hard to postpone urination?: No Burning/pain with urination?: No Get up at night to urinate?: No Leakage of urine?: No Urine stream starts and stops?: No Trouble starting stream?: No Do you have to strain to urinate?: No Blood in urine?: No Urinary tract infection?: No Sexually transmitted disease?: No Injury to kidneys or bladder?: No Painful intercourse?: No Weak stream?: No Erection problems?: No Penile pain?: No  Gastrointestinal Nausea?: No Vomiting?: No Indigestion/heartburn?: No Diarrhea?: No Constipation?: No  Constitutional Fever: No Night sweats?: No Weight loss?: No Fatigue?: No  Skin Skin rash/lesions?: No Itching?: No  Eyes Blurred vision?: No Double vision?: No  Ears/Nose/Throat Sore throat?: No Sinus problems?: No  Hematologic/Lymphatic Swollen glands?: No Easy bruising?: No  Cardiovascular  Leg swelling?: No Chest pain?: No  Respiratory Cough?: No Shortness of breath?: No  Endocrine Excessive thirst?: No  Musculoskeletal Back pain?: No Joint pain?: No  Neurological Headaches?: No Dizziness?: No  Psychologic Depression?: No Anxiety?: No  Physical Exam: BP 134/79   Pulse 76   Ht 5\' 9"  (1.753 m)   Wt 180 lb (81.6 kg)   BMI 26.58 kg/m   Constitutional:  Alert and oriented, No  acute distress. HEENT: [place] AT, moist mucus membranes.  Trachea midline, no masses. Cardiovascular: No clubbing, cyanosis, or edema. Respiratory: Normal respiratory effort, no increased work of breathing. GI: Abdomen is soft, nontender, nondistended, no abdominal masses Palpable: Normal sphincter tone.  Enlarged 50+ cc prostate, nontender, nonnodular. Skin: No rashes, bruises or suspicious lesions. Neurologic: Grossly intact, no focal deficits, moving all 4 extremities. Psychiatric: Normal mood and affect.  Laboratory Data: PSA trend as above    Assessment & Plan:    1. Elevated PSA Personal history of elevated PSA which is been as high as 5 in the past.  Most recent PSA is trending back upwards to his previous peak  This is likely appropriate for his gland size and is we will continue to hold off on prostate biopsy, however, would be more reassured that biopsy is not necessary if he has a negative imaging.  At that case, we can push out surveillance to less frequent.  Option of pursuing biopsy was also discussed.  Risk and benefits of each were discussed.  He is interested in pursuing prostate MRI.  We will call him the results.  He understands that if high-grade lesion is present, he would need a prostate biopsy versus fusion biopsy.  - PSA; Future - MR PROSTATE W WO CONTRAST; Future  2. BPH without obstruction/lower urinary tract symptoms As above - PSA; Future - MR PROSTATE W WO CONTRAST; Future   Follow-up in 1 year for PSA/DRE as long as prostate MRI is reassuring, will call with prostate MRI results  Hollice Espy, MD  [facility] [address], [place], [place] [address] [phone]

## 2019-05-08 ENCOUNTER — Other Ambulatory Visit: Payer: Self-pay

## 2019-05-08 ENCOUNTER — Ambulatory Visit
Admission: RE | Admit: 2019-05-08 | Discharge: 2019-05-08 | Disposition: A | Discharge status: Home / Self Care | Payer: 59 | Source: Ambulatory Visit | Attending: Urology | Admitting: Urology

## 2019-05-08 DIAGNOSIS — R972 Elevated prostate specific antigen [PSA]: Secondary | ICD-10-CM | POA: Insufficient documentation

## 2019-05-08 DIAGNOSIS — N400 Enlarged prostate without lower urinary tract symptoms: Secondary | ICD-10-CM | POA: Diagnosis present

## 2019-05-08 LAB — POCT I-STAT CREATININE: Creatinine, Ser: 0.8 mg/dL (ref 0.61–1.24)

## 2019-05-08 MED ORDER — GADOBUTROL 1 MMOL/ML IV SOLN
8.0000 mL | Freq: Once | INTRAVENOUS | Status: AC | PRN
  Administered 2019-05-08: 8 mL via INTRAVENOUS

## 2019-05-18 ENCOUNTER — Telehealth: Payer: Self-pay | Admitting: *Deleted

## 2019-05-18 DIAGNOSIS — R972 Elevated prostate specific antigen [PSA]: Secondary | ICD-10-CM

## 2019-05-18 NOTE — Telephone Encounter (Addendum)
Patient notified-lab appointment and follow up scheduled. Verbalized understanding.    ----- Message from Hollice Espy, MD sent at 05/18/2019  3:26 PM EDT ----- Called to discuss MRI results.  In combination with his fluctuating PSA, history, and exam findings, this result is fairly equivocal.  We discussed options including pursuing prostate MRI versus close surveillance with PSA in 6 months.  At this point, both these options seem reasonable.  He is most interested in surveillance.  Please arrange for follow-up in 6 months with me with PSA prior.  Hollice Espy, MD

## 2019-11-16 ENCOUNTER — Other Ambulatory Visit: Payer: 59

## 2019-11-16 ENCOUNTER — Other Ambulatory Visit: Payer: Self-pay

## 2019-11-16 DIAGNOSIS — R972 Elevated prostate specific antigen [PSA]: Secondary | ICD-10-CM

## 2019-11-17 LAB — PSA: Prostate Specific Ag, Serum: 6.4 ng/mL — ABNORMAL HIGH (ref 0.0–4.0)

## 2019-11-20 NOTE — Progress Notes (Signed)
11/21/19 11:15 AM   David Kirby December 21, 1955 [id number]  Referring provider: [name], MD [facility] [place],  [place] [address] Chief Complaint  Patient presents with  . Elevated PSA    HPI: David Kirby is a 64 y.o. M who returns today for 6 month f/u for the evaluation and management of elevated PSA.   Most recent PSA from 11/16/19 elevated to 6.4 from previous PSA of 5.04 on 03/16/2019.   MR of prostate w and w/o constrast revealed PI-RADS category 3 lesion of the right posterolateral peripheral zone in the apex, measuring 0.28 cubic cm and PI-RADS category 3 lesion of the right posterior peripheral zone and right posterior transition zone in the apex. Evidence of benign prostatic hypertrophy and no findings of locally advanced or pelvic metastatic disease noted.   He currently has a lot going on including his wife having a stent placed in her heart. He is concerned about potential risk of infection.  No previous biopsies or imaging.  No family history of prostate cancer.  PSA Trend 11/16/19, 6.4 03/16/2019, 5.04 10/27/2018, 4.3 06/15/18, 4.4  02/11/18, 5.01 12/21/2017, 5.48  12/2015, 2.82  PMH: Past Medical History:  Diagnosis Date  . Chronic insomnia   . GERD (gastroesophageal reflux disease)   . H/O Malignant melanoma    LEFT LEG  . Hypercholesteremia     Surgical History: Past Surgical History:  Procedure Laterality Date  . COLONOSCOPY WITH PROPOFOL N/A 04/13/2016   Procedure: COLONOSCOPY WITH PROPOFOL;  Surgeon: Lollie Sails, MD;  Location: [facility] ENDOSCOPY;  Service: Endoscopy;  Laterality: N/A;  . ESOPHAGOGASTRODUODENOSCOPY (EGD) WITH PROPOFOL N/A 04/13/2016   Procedure: ESOPHAGOGASTRODUODENOSCOPY (EGD) WITH PROPOFOL;  Surgeon: Lollie Sails, MD;  Location: [facility] ENDOSCOPY;  Service: Endoscopy;  Laterality: N/A;  . EXCISION MASS LOWER EXTREMETIES Left    EXCISION MALIGNANT MELANOMA  . TONSILLECTOMY     W/ADENOIDECTOMY    Home Medications:  Allergies as of 11/21/2019      Reactions   Biaxin [clarithromycin] Other (See Comments)   G.I.UPSET   Penicillins Other (See Comments)   G.I.UPSET   Tetracyclines & Related Swelling      Medication List       Accurate as of Nov 21, 2019 11:15 AM. If you have any questions, ask your nurse or doctor.        CENTRUM SILVER PO Take by mouth.   Melatonin 1 MG Caps Take by mouth.   omeprazole 20 MG capsule Commonly known as: PRILOSEC Take 20 mg by mouth daily.   senna-docusate 8.6-50 MG tablet Commonly known as: Senokot-S Take by mouth.       Allergies:  Allergies  Allergen Reactions  . Biaxin [Clarithromycin] Other (See Comments)    G.I.UPSET   . Penicillins Other (See Comments)    G.I.UPSET   . Tetracyclines & Related Swelling    Family History: No family history on file.  Social History:  reports that he has never smoked. He has never used smokeless tobacco. He reports current alcohol use of about 2.0 standard drinks of alcohol per week. He reports that he does not use drugs.   Physical Exam: BP (!) 144/89   Pulse (!) 57   Ht 5\' 9"  (1.753 m)   Wt 181 lb (82.1 kg)   BMI 26.73 kg/m   Constitutional:  Alert and oriented, No acute distress. HEENT: [place] AT, moist mucus membranes.  Trachea midline, no masses. Cardiovascular: No clubbing, cyanosis, or edema.  Respiratory: Normal respiratory effort, no increased work of breathing. Skin: No rashes, bruises or suspicious lesions. Neurologic: Grossly intact, no focal deficits, moving all 4 extremities. Psychiatric: Normal mood and affect.  Pertinent Imaging: CLINICAL DATA:  Elevated PSA level of 5.0 in July 2020.  EXAM: MR PROSTATE WITHOUT AND WITH CONTRAST  TECHNIQUE: Multiplanar multisequence MRI images were obtained of the pelvis centered about the prostate. Pre and post contrast images were obtained.  CONTRAST:  42mL GADAVIST GADOBUTROL 1 MMOL/ML IV  SOLN  COMPARISON:  None.  FINDINGS: Prostate:  Region of interest # 1: PI-RADS category 3 lesion of the right posterolateral peripheral zone in the apex, with reduced T2 signal but with only linear accentuated enhancement rather than well-defined focal enhancement. This lesion measures 0.28 cubic cm (1.0 by 0.5 by 0.7 cm).  Region of interest # 2: PI-RADS category 3 lesion of the right posterior peripheral zone and likely right posterior transition zone with low T2 signal but no accentuated enhancement or accentuated diffusion weighted activity. This lesion measures 0.45 cubic cm (1.3 by 0.5 by 0.7 cm).  Evidence of benign prostatic hypertrophy.  Volume: 3D volumetric analysis: Prostate volume 64.46 cubic cm (5.8 by 4.7 by 4.8 cm).  Transcapsular spread:  Absent  Seminal vesicle involvement: Absent  Neurovascular bundle involvement: Absent  Pelvic adenopathy: Absent  Bone metastasis: Absent  Other findings: No supplemental non-categorized findings.  IMPRESSION: 1. PI-RADS category 3 lesion of the right posterolateral peripheral zone in the apex, measuring 0.28 cubic cm. 2. PI-RADS category 3 lesion of the right posterior peripheral zone and right posterior transition zone in the apex. 3. Evidence of benign prostatic hypertrophy. 4. No findings of locally advanced or pelvic metastatic disease.   Electronically Signed   By: Van Clines M.D.   On: 05/08/2019 10:14  I have personally reviewed the images and agree with radiologist interpretation.   Assessment & Plan:    1. Rising PSA  Prostate MRI showed equivocal PI-RAD 3 lesion in the setting of rising PSA and overall trend  At this point, recommend biopsy in setting of above We discussed fusion prostate biopsy vs. standard prostate biopsy in detail including the procedure itself, the risks of blood in the urine, stool, and ejaculate, serious infection, and discomfort. He has elected to  proceed w/ in-office prostate biopsy as discussed.  [facility] [address], [place], [place] [address] [phone]  I, Lucas Mallow, am acting as a scribe for Dr. Hollice Espy,  I have reviewed the above documentation for accuracy and completeness, and I agree with the above.   Hollice Espy, MD

## 2019-11-21 ENCOUNTER — Ambulatory Visit (INDEPENDENT_AMBULATORY_CARE_PROVIDER_SITE_OTHER): Payer: 59 | Admitting: Urology

## 2019-11-21 ENCOUNTER — Other Ambulatory Visit: Payer: Self-pay

## 2019-11-21 ENCOUNTER — Encounter: Payer: Self-pay | Admitting: Urology

## 2019-11-21 VITALS — BP 144/89 | HR 57 | Ht 69.0 in | Wt 181.0 lb

## 2019-11-21 DIAGNOSIS — R972 Elevated prostate specific antigen [PSA]: Secondary | ICD-10-CM | POA: Diagnosis not present

## 2019-11-21 NOTE — Patient Instructions (Signed)

## 2019-12-20 ENCOUNTER — Other Ambulatory Visit: Payer: Self-pay

## 2019-12-20 ENCOUNTER — Ambulatory Visit (INDEPENDENT_AMBULATORY_CARE_PROVIDER_SITE_OTHER): Payer: 59 | Admitting: Urology

## 2019-12-20 VITALS — BP 145/87 | HR 60

## 2019-12-20 DIAGNOSIS — R972 Elevated prostate specific antigen [PSA]: Secondary | ICD-10-CM

## 2019-12-20 MED ORDER — GENTAMICIN SULFATE 40 MG/ML IJ SOLN
80.0000 mg | Freq: Once | INTRAMUSCULAR | Status: AC
  Administered 2019-12-20: 80 mg via INTRAMUSCULAR

## 2019-12-20 MED ORDER — LEVOFLOXACIN 500 MG PO TABS
500.0000 mg | ORAL_TABLET | Freq: Once | ORAL | Status: AC
  Administered 2019-12-20: 500 mg via ORAL

## 2019-12-20 NOTE — Progress Notes (Signed)
Prostate Biopsy Procedure   Informed consent was obtained after discussing risks/benefits of the procedure.  A time out was performed to ensure correct patient identity.  Pre-Procedure: - Last PSA Level: 6.4 - Gentamicin given prophylactically - Levaquin 500 mg administered PO -Transrectal Ultrasound performed revealing a 79 gm prostate -No significant hypoechoic or median lobe noted  Procedure: - Prostate block performed using 10 cc 1% lidocaine and biopsies taken from sextant areas, a total of 12 under ultrasound guidance.  Post-Procedure: - Patient tolerated the procedure well - He was counseled to seek immediate medical attention if experiences any severe pain, significant bleeding, or fevers - Return in one week to discuss biopsy results  I, Nethusan Sivanesan, am acting as a scribe for Dr. Hollice Espy,  I have reviewed the above documentation for accuracy and completeness, and I agree with the above.   Hollice Espy, MD

## 2019-12-21 ENCOUNTER — Telehealth: Payer: Self-pay | Admitting: *Deleted

## 2019-12-21 LAB — SURGICAL PATHOLOGY

## 2019-12-21 NOTE — Telephone Encounter (Addendum)
Patient informed, scheduled follow up appointment. Voiced understanding.   ----- Message from Hollice Espy, MD sent at 12/21/2019  3:22 PM EDT ----- Please let this patient know that his biopsy results are already back and he has a very small amount of minute low risk cancer.  This probably means we are just going to keep an eye on it.  If he does not want a wait until his appointment since it so far out, looks like I have an appointment available on Tuesday at 1030 if he would like to be seen earlier to discuss this and detail.  Hollice Espy, MD

## 2019-12-25 NOTE — Progress Notes (Signed)
12/26/19  4:23 PM   David Kirby [birth year] [id number]  Referring provider: [name], MD [facility] ([facility] [place],  [place] [address] Chief Complaint  Patient presents with  . Results    HPI: David Kirby is a 64 y.o. M who returns today for 6 month f/u prostate biopsy results  Most recent PSA from 11/16/19 elevated to 6.4 from previous PSA of 5.04 on 03/16/2019.   MR of prostate w and w/o constrast revealed PI-RADS category 3 lesion of the right posterolateral peripheral zone in the apex, measuring 0.28 cubic cm and PI-RADS category 3 lesion of the right posterior peripheral zone and right posterior transition zone in the apex. Evidence of benign prostatic hypertrophy and no findings of locally advanced or pelvic metastatic disease noted.    He had a prostate bx on 12/20/19. His path report indicates 1 of 12 cores involved low volume Gleason 3+3 up to 3% of tissue at right mid.  TRUS 79 gm    No family history of prostate cancer.  PSA Trend 11/16/19, 6.4 03/16/2019, 5.04 10/27/2018, 4.3 06/15/18, 4.4  02/11/18, 5.01 12/21/2017, 5.48  12/2015, 2.82  IPSS    Row Name 12/26/19 1000         International Prostate Symptom Score   How often have you had the sensation of not emptying your bladder?  Not at All     How often have you had to urinate less than every two hours?  Not at All     How often have you found you stopped and started again several times when you urinated?  Not at All     How often have you found it difficult to postpone urination?  Not at All     How often have you had a weak urinary stream?  Not at All     How often have you had to strain to start urination?  Not at All     How many times did you typically get up at night to urinate?  1 Time     Total IPSS Score  1       Quality of Life due to urinary symptoms   If you were to spend the rest of your life with your urinary condition just the way it is now how would you feel  about that?  Pleased        Score:  1-7 Mild 8-19 Moderate 20-35 Severe  SHIM    Row Name 12/26/19 1035         SHIM: Over the last 6 months:   How do you rate your confidence that you could get and keep an erection?  Moderate     When you had erections with sexual stimulation, how often were your erections hard enough for penetration (entering your partner)?  Most Times (much more than half the time)     During sexual intercourse, how often were you able to maintain your erection after you had penetrated (entered) your partner?  Most Times (much more than half the time)     During sexual intercourse, how difficult was it to maintain your erection to completion of intercourse?  Slightly Difficult     When you attempted sexual intercourse, how often was it satisfactory for you?  Sometimes (about half the time)       SHIM Total Score   SHIM  18        Score: 1-7 Severe ED 8-11 Moderate ED  12-16 Mild-Moderate ED 17-21 Mild ED 22-25 No ED   PMH: Past Medical History:  Diagnosis Date  . Chronic insomnia   . GERD (gastroesophageal reflux disease)   . H/O Malignant melanoma    LEFT LEG  . Hypercholesteremia     Surgical History: Past Surgical History:  Procedure Laterality Date  . COLONOSCOPY WITH PROPOFOL N/A 04/13/2016   Procedure: COLONOSCOPY WITH PROPOFOL;  Surgeon: Lollie Sails, MD;  Location: [facility] ENDOSCOPY;  Service: Endoscopy;  Laterality: N/A;  . ESOPHAGOGASTRODUODENOSCOPY (EGD) WITH PROPOFOL N/A 04/13/2016   Procedure: ESOPHAGOGASTRODUODENOSCOPY (EGD) WITH PROPOFOL;  Surgeon: Lollie Sails, MD;  Location: [facility] ENDOSCOPY;  Service: Endoscopy;  Laterality: N/A;  . EXCISION MASS LOWER EXTREMETIES Left    EXCISION MALIGNANT MELANOMA  . TONSILLECTOMY     W/ADENOIDECTOMY    Home Medications:  Allergies as of 12/26/2019      Reactions   Biaxin [clarithromycin] Other (See Comments)   G.I.UPSET   Penicillins Other (See Comments)   G.I.UPSET    Tetracyclines & Related Swelling      Medication List       Accurate as of December 26, 2019 11:59 PM. If you have any questions, ask your nurse or doctor.        CENTRUM SILVER PO Take by mouth.   Melatonin 1 MG Caps Take by mouth.   omeprazole 20 MG capsule Commonly known as: PRILOSEC Take 20 mg by mouth daily.   senna-docusate 8.6-50 MG tablet Commonly known as: Senokot-S Take by mouth.       Allergies:  Allergies  Allergen Reactions  . Biaxin [Clarithromycin] Other (See Comments)    G.I.UPSET   . Penicillins Other (See Comments)    G.I.UPSET   . Tetracyclines & Related Swelling    Family History: No family history on file.  Social History:  reports that he has never smoked. He has never used smokeless tobacco. He reports current alcohol use of about 2.0 standard drinks of alcohol per week. He reports that he does not use drugs.   Physical Exam: BP (!) 153/87   Pulse 63   Constitutional:  Alert and oriented, No acute distress. HEENT: [place] AT, moist mucus membranes.  Trachea midline, no masses. Cardiovascular: No clubbing, cyanosis, or edema. Respiratory: Normal respiratory effort, no increased work of breathing. Skin: No rashes, bruises or suspicious lesions. Neurologic: Grossly intact, no focal deficits, moving all 4 extremities. Psychiatric: Normal mood and affect.  Assessment & Plan:    1. Prostate cancer Prostate bx report reviewed   Very low risk prostate cancer  The patient was counseled about the natural history of prostate cancer and the standard treatment options that are available for prostate cancer. It was explained to him how his age and life expectancy, clinical stage, Gleason score, and PSA affect his prognosis, the decision to proceed with additional staging studies, as well as how that information influences recommended treatment strategies. We discussed the roles for active surveillance,  The patient was encouraged to ask questions  throughout the discussion today and all questions were answered to his stated satisfaction. In addition, the patient was provided with and/or directed to appropriate resources and literature for further education about prostate cancer treatment options.  According to AUA and NCCN guidelines, recommended active surveillance   Plan for PSA x 3 per year w/ DRE once a year.   May consider MRI or repeat bx in 1 year.  Return in 4 months for PSA lab only and MD visit w/  PSA in 8 months    [facility] [address], [place], [place] [address] [phone]  I, Lucas Mallow, am acting as a scribe for Dr. Hollice Espy,  I have reviewed the above documentation for accuracy and completeness, and I agree with the above.   Hollice Espy, MD

## 2019-12-26 ENCOUNTER — Other Ambulatory Visit: Payer: Self-pay

## 2019-12-26 ENCOUNTER — Ambulatory Visit (INDEPENDENT_AMBULATORY_CARE_PROVIDER_SITE_OTHER): Payer: 59 | Admitting: Urology

## 2019-12-26 ENCOUNTER — Encounter: Payer: Self-pay | Admitting: Urology

## 2019-12-26 VITALS — BP 153/87 | HR 63

## 2019-12-26 DIAGNOSIS — C61 Malignant neoplasm of prostate: Secondary | ICD-10-CM

## 2019-12-28 DIAGNOSIS — C61 Malignant neoplasm of prostate: Secondary | ICD-10-CM | POA: Insufficient documentation

## 2020-01-04 ENCOUNTER — Ambulatory Visit: Payer: Self-pay | Admitting: Urology

## 2020-04-05 ENCOUNTER — Ambulatory Visit: Payer: 59 | Admitting: Urology

## 2020-04-26 ENCOUNTER — Other Ambulatory Visit: Payer: 59

## 2020-04-26 ENCOUNTER — Other Ambulatory Visit: Payer: Self-pay

## 2020-04-26 DIAGNOSIS — C61 Malignant neoplasm of prostate: Secondary | ICD-10-CM

## 2020-04-27 LAB — PSA: Prostate Specific Ag, Serum: 4.8 ng/mL — ABNORMAL HIGH (ref 0.0–4.0)

## 2020-05-01 ENCOUNTER — Telehealth: Payer: Self-pay

## 2020-05-01 NOTE — Telephone Encounter (Signed)
-----   Message from Hollice Espy, MD sent at 05/01/2020 10:15 AM EDT ----- PSA is stable, great news.   Hollice Espy, MD

## 2020-05-01 NOTE — Telephone Encounter (Signed)
Pt aware.

## 2020-05-31 ENCOUNTER — Other Ambulatory Visit: Payer: Self-pay

## 2020-05-31 ENCOUNTER — Other Ambulatory Visit
Admission: RE | Admit: 2020-05-31 | Discharge: 2020-05-31 | Disposition: A | Discharge status: Home / Self Care | Payer: 59 | Source: Ambulatory Visit | Attending: Gastroenterology | Admitting: Gastroenterology

## 2020-05-31 DIAGNOSIS — Z01812 Encounter for preprocedural laboratory examination: Secondary | ICD-10-CM | POA: Insufficient documentation

## 2020-05-31 DIAGNOSIS — Z20822 Contact with and (suspected) exposure to covid-19: Secondary | ICD-10-CM | POA: Insufficient documentation

## 2020-05-31 LAB — SARS CORONAVIRUS 2 (TAT 6-24 HRS): SARS Coronavirus 2: NEGATIVE

## 2020-06-04 ENCOUNTER — Ambulatory Visit: Payer: 59 | Admitting: Anesthesiology

## 2020-06-04 ENCOUNTER — Other Ambulatory Visit: Payer: Self-pay

## 2020-06-04 ENCOUNTER — Encounter
Admission: RE | Disposition: A | Discharge status: Home / Self Care | Payer: Self-pay | Source: Home / Self Care | Attending: Gastroenterology

## 2020-06-04 ENCOUNTER — Ambulatory Visit
Admission: RE | Admit: 2020-06-04 | Discharge: 2020-06-04 | Disposition: A | Discharge status: Home / Self Care | Payer: 59 | Attending: Gastroenterology | Admitting: Gastroenterology

## 2020-06-04 ENCOUNTER — Encounter: Payer: Self-pay | Admitting: *Deleted

## 2020-06-04 DIAGNOSIS — Z888 Allergy status to other drugs, medicaments and biological substances status: Secondary | ICD-10-CM | POA: Diagnosis not present

## 2020-06-04 DIAGNOSIS — Z79899 Other long term (current) drug therapy: Secondary | ICD-10-CM | POA: Diagnosis not present

## 2020-06-04 DIAGNOSIS — Z09 Encounter for follow-up examination after completed treatment for conditions other than malignant neoplasm: Secondary | ICD-10-CM | POA: Diagnosis not present

## 2020-06-04 DIAGNOSIS — Z880 Allergy status to penicillin: Secondary | ICD-10-CM | POA: Diagnosis not present

## 2020-06-04 DIAGNOSIS — Z86010 Personal history of colonic polyps: Secondary | ICD-10-CM | POA: Insufficient documentation

## 2020-06-04 DIAGNOSIS — D120 Benign neoplasm of cecum: Secondary | ICD-10-CM | POA: Insufficient documentation

## 2020-06-04 DIAGNOSIS — K2270 Barrett's esophagus without dysplasia: Secondary | ICD-10-CM | POA: Insufficient documentation

## 2020-06-04 DIAGNOSIS — K640 First degree hemorrhoids: Secondary | ICD-10-CM | POA: Diagnosis not present

## 2020-06-04 DIAGNOSIS — Z881 Allergy status to other antibiotic agents status: Secondary | ICD-10-CM | POA: Diagnosis not present

## 2020-06-04 HISTORY — PX: COLONOSCOPY WITH PROPOFOL: SHX5780

## 2020-06-04 HISTORY — PX: ESOPHAGOGASTRODUODENOSCOPY (EGD) WITH PROPOFOL: SHX5813

## 2020-06-04 SURGERY — COLONOSCOPY WITH PROPOFOL
Anesthesia: General

## 2020-06-04 MED ORDER — LIDOCAINE HCL (CARDIAC) PF 100 MG/5ML IV SOSY
PREFILLED_SYRINGE | INTRAVENOUS | Status: DC | PRN
  Administered 2020-06-04: 100 mg via INTRAVENOUS

## 2020-06-04 MED ORDER — SODIUM CHLORIDE 0.9 % IV SOLN: INTRAVENOUS | Status: DC

## 2020-06-04 MED ORDER — PROPOFOL 10 MG/ML IV BOLUS
INTRAVENOUS | Status: DC | PRN
  Administered 2020-06-04: 10 mg via INTRAVENOUS
  Administered 2020-06-04: 60 mg via INTRAVENOUS

## 2020-06-04 MED ORDER — PROPOFOL 500 MG/50ML IV EMUL
INTRAVENOUS | Status: DC | PRN
  Administered 2020-06-04: 145 ug/kg/min via INTRAVENOUS

## 2020-06-04 NOTE — Op Note (Signed)
[facility] Gastroenterology Patient Name: David Kirby Procedure Date: 06/04/2020 8:56 AM MRN: [id number] Account #: [id number] Date of Birth: Aug 29, 1955 Admit Type: Outpatient Age: 64 Room: [facility] ENDO ROOM 1 Gender: Male Note Status: Finalized Procedure:             Colonoscopy Indications:           High risk colon cancer surveillance: Personal history                         of multiple (3 or more) adenomas Providers:             Andrey Farmer MD, MD Referring MD:          Christena Flake. Raechel Ache, MD (Referring MD) Medicines:             Monitored Anesthesia Care Complications:         No immediate complications. Estimated blood loss:                         Minimal. Procedure:             Pre-Anesthesia Assessment:                        - Prior to the procedure, a History and Physical was                         performed, and patient medications and allergies were                         reviewed. The patient is competent. The risks and                         benefits of the procedure and the sedation options and                         risks were discussed with the patient. All questions                         were answered and informed consent was obtained.                         Patient identification and proposed procedure were                         verified by the physician, the nurse, the anesthetist                         and the technician in the endoscopy suite. Mental                         Status Examination: alert and oriented. Airway                         Examination: normal oropharyngeal airway and neck                         mobility. Respiratory Examination: clear to  auscultation. CV Examination: normal. Prophylactic                         Antibiotics: The patient does not require prophylactic                         antibiotics. Prior Anticoagulants: The patient has                         taken no previous  anticoagulant or antiplatelet                         agents. ASA Grade Assessment: II - A patient with mild                         systemic disease. After reviewing the risks and                         benefits, the patient was deemed in satisfactory                         condition to undergo the procedure. The anesthesia                         plan was to use monitored anesthesia care (MAC).                         Immediately prior to administration of medications,                         the patient was re-assessed for adequacy to receive                         sedatives. The heart rate, respiratory rate, oxygen                         saturations, blood pressure, adequacy of pulmonary                         ventilation, and response to care were monitored                         throughout the procedure. The physical status of the                         patient was re-assessed after the procedure.                        After obtaining informed consent, the colonoscope was                         passed under direct vision. Throughout the procedure,                         the patient's blood pressure, pulse, and oxygen                         saturations were monitored continuously. The  Colonoscope was introduced through the anus and                         advanced to the the cecum, identified by appendiceal                         orifice and ileocecal valve. The colonoscopy was                         performed without difficulty. The patient tolerated                         the procedure well. The quality of the bowel                         preparation was good. Findings:      The perianal and digital rectal examinations were normal.      A 1 mm polyp was found in the cecum. The polyp was sessile. The polyp       was removed with a jumbo cold forceps. Resection and retrieval were       complete. Estimated blood loss was minimal.       Non-bleeding internal hemorrhoids were found during retroflexion. The       hemorrhoids were Grade I (internal hemorrhoids that do not prolapse).      The exam was otherwise without abnormality on direct and retroflexion       views. Impression:            - One 1 mm polyp in the cecum, removed with a jumbo                         cold forceps. Resected and retrieved.                        - Non-bleeding internal hemorrhoids.                        - The examination was otherwise normal on direct and                         retroflexion views. Recommendation:        - Discharge patient to home.                        - Resume previous diet.                        - Continue present medications.                        - Await pathology results.                        - Repeat colonoscopy date to be determined after                         pending pathology results are reviewed for                         surveillance.                        -  Return to referring physician as previously                         scheduled. Procedure Code(s):     --- Professional ---                        [phone], Colonoscopy, flexible; with biopsy, single or                         multiple Diagnosis Code(s):     --- Professional ---                        Z86.010, Personal history of colonic polyps                        K63.5, Polyp of colon                        K64.0, First degree hemorrhoids CPT copyright 2019 American Medical Association. All rights reserved. The codes documented in this report are preliminary and upon coder review may  be revised to meet current compliance requirements. Andrey Farmer, MD Andrey Farmer MD, MD 06/04/2020 9:28:12 AM Number of Addenda: 0 Note Initiated On: 06/04/2020 8:56 AM Scope Withdrawal Time: 0 hours 9 minutes 21 seconds  Total Procedure Duration: 0 hours 15 minutes 20 seconds  Estimated Blood Loss:  Estimated blood loss was minimal.      [facility]

## 2020-06-04 NOTE — Anesthesia Postprocedure Evaluation (Signed)
Anesthesia Post Note  Patient: David Kirby  Procedure(s) Performed: COLONOSCOPY WITH PROPOFOL (N/A ) ESOPHAGOGASTRODUODENOSCOPY (EGD) WITH PROPOFOL (N/A )  Patient location during evaluation: Endoscopy Anesthesia Type: General Level of consciousness: awake and alert and oriented Pain management: pain level controlled Vital Signs Assessment: post-procedure vital signs reviewed and stable Respiratory status: spontaneous breathing, nonlabored ventilation and respiratory function stable Cardiovascular status: blood pressure returned to baseline and stable Postop Assessment: no signs of nausea or vomiting Anesthetic complications: no   No complications documented.   Last Vitals:  Vitals:   06/04/20 0935 06/04/20 0945  BP: 119/87 120/84  Pulse: 96 79  Resp: 18 14  Temp:    SpO2: 99% 100%    Last Pain:  Vitals:   06/04/20 0945  TempSrc:   PainSc: 0-No pain                 Jove Beyl

## 2020-06-04 NOTE — Transfer of Care (Signed)
Immediate Anesthesia Transfer of Care Note  Patient: David Kirby  Procedure(s) Performed: COLONOSCOPY WITH PROPOFOL (N/A ) ESOPHAGOGASTRODUODENOSCOPY (EGD) WITH PROPOFOL (N/A )  Patient Location: PACU  Anesthesia Type:General  Level of Consciousness: awake, drowsy and patient cooperative  Airway & Oxygen Therapy: Patient Spontanous Breathing and Patient connected to face mask oxygen  Post-op Assessment: Report given to RN and Post -op Vital signs reviewed and stable  Post vital signs: Reviewed and stable  Last Vitals:  Vitals Value Taken Time  BP 111/73 06/04/20 0925  Temp 36.2 C 06/04/20 0925  Pulse 99 06/04/20 0928  Resp 16 06/04/20 0928  SpO2 98 % 06/04/20 0928  Vitals shown include unvalidated device data.  Last Pain:  Vitals:   06/04/20 0925  TempSrc: Temporal  PainSc: 0-No pain         Complications: No complications documented.

## 2020-06-04 NOTE — Anesthesia Preprocedure Evaluation (Signed)
Anesthesia Evaluation  Patient identified by MRN, date of birth, ID band Patient awake    Reviewed: Allergy & Precautions, H&P , NPO status , Patient's Chart, lab work & pertinent test results  History of Anesthesia Complications Negative for: history of anesthetic complications  Airway Mallampati: III  TM Distance: >3 FB Neck ROM: limited    Dental  (+) Chipped   Pulmonary neg pulmonary ROS, neg shortness of breath,    Pulmonary exam normal        Cardiovascular Exercise Tolerance: Good (-) angina(-) Past MI and (-) DOE negative cardio ROS Normal cardiovascular exam     Neuro/Psych negative neurological ROS  negative psych ROS   GI/Hepatic Neg liver ROS, GERD  Medicated and Controlled,  Endo/Other  negative endocrine ROS  Renal/GU negative Renal ROS  negative genitourinary   Musculoskeletal   Abdominal   Peds  Hematology negative hematology ROS (+)   Anesthesia Other Findings Past Medical History: No date: Chronic insomnia No date: GERD (gastroesophageal reflux disease) No date: H/O Malignant melanoma     Comment:  LEFT LEG No date: Hypercholesteremia  Past Surgical History: 04/13/2016: COLONOSCOPY WITH PROPOFOL; N/A     Comment:  Procedure: COLONOSCOPY WITH PROPOFOL;  Surgeon: Lollie Sails, MD;  Location: [facility] ENDOSCOPY;  Service:               Endoscopy;  Laterality: N/A; 04/13/2016: ESOPHAGOGASTRODUODENOSCOPY (EGD) WITH PROPOFOL; N/A     Comment:  Procedure: ESOPHAGOGASTRODUODENOSCOPY (EGD) WITH               PROPOFOL;  Surgeon: Lollie Sails, MD;  Location:               [facility] ENDOSCOPY;  Service: Endoscopy;  Laterality: N/A; No date: EXCISION MASS LOWER EXTREMETIES; Left     Comment:  EXCISION MALIGNANT MELANOMA No date: TONSILLECTOMY     Comment:  W/ADENOIDECTOMY  BMI    Body Mass Index: 27.47 kg/m      Reproductive/Obstetrics negative OB ROS                              Anesthesia Physical Anesthesia Plan  ASA: II  Anesthesia Plan: General   Post-op Pain Management:    Induction: Intravenous  PONV Risk Score and Plan: Propofol infusion and TIVA  Airway Management Planned: Natural Airway and Nasal Cannula  Additional Equipment:   Intra-op Plan:   Post-operative Plan:   Informed Consent: I have reviewed the patients History and Physical, chart, labs and discussed the procedure including the risks, benefits and alternatives for the proposed anesthesia with the patient or authorized representative who has indicated his/her understanding and acceptance.     Dental Advisory Given  Plan Discussed with: Anesthesiologist, CRNA and Surgeon  Anesthesia Plan Comments: (Patient consented for risks of anesthesia including but not limited to:  - adverse reactions to medications - risk of intubation if required - damage to eyes, teeth, lips or other oral mucosa - nerve damage due to positioning  - sore throat or hoarseness - Damage to heart, brain, nerves, lungs, other parts of body or loss of life  Patient voiced understanding.)        Anesthesia Quick Evaluation

## 2020-06-04 NOTE — H&P (Signed)
Outpatient short stay form Pre-procedure 06/04/2020 8:49 AM David Miyamoto MD, MPH  Primary Physician: Dr. Raechel Ache  Reason for visit:  Barrett's Screening  History of present illness:   64 y/o gentleman with reported history of BE's and history of colon polyps here for surveillance procedures. No family history of GI malignancies, no abdominal surgeries, no blood thinners.    Current Facility-Administered Medications:  .  0.9 %  sodium chloride infusion, , Intravenous, Continuous, Vallie Fayette, Hilton Cork, MD, Last Rate: 20 mL/hr at 06/04/20 0805, New Bag at 06/04/20 0805  Medications Prior to Admission  Medication Sig Dispense Refill Last Dose  . Melatonin 1 MG CAPS Take by mouth.   Past Week at Unknown time  . Multiple Vitamins-Minerals (CENTRUM SILVER PO) Take by mouth.   Past Week at Unknown time  . omeprazole (PRILOSEC) 20 MG capsule Take 20 mg by mouth daily.   06/02/2020  . senna-docusate (SENOKOT-S) 8.6-50 MG tablet Take by mouth.   06/02/2020     Allergies  Allergen Reactions  . Biaxin [Clarithromycin] Other (See Comments)    G.I.UPSET   . Penicillins Other (See Comments)    G.I.UPSET   . Tetracyclines & Related Swelling     Past Medical History:  Diagnosis Date  . Chronic insomnia   . GERD (gastroesophageal reflux disease)   . H/O Malignant melanoma    LEFT LEG  . Hypercholesteremia     Review of systems:  Otherwise negative.    Physical Exam  Gen: Alert, oriented. Appears stated age.  HEENT: PERRLA. Lungs: No respiratory distres CV: RRR Abd: soft, benign, no masses.  Ext: No edema.     Planned procedures: Proceed with EGD/colonoscopy. The patient understands the nature of the planned procedure, indications, risks, alternatives and potential complications including but not limited to bleeding, infection, perforation, damage to internal organs and possible oversedation/side effects from anesthesia. The patient agrees and gives consent to proceed.   Please refer to procedure notes for findings, recommendations and patient disposition/instructions.     David Miyamoto MD, MPH Gastroenterology 06/04/2020  8:49 AM

## 2020-06-04 NOTE — Progress Notes (Signed)
   06/04/20 0740  Clinical Encounter Type  Visited With Family  Visit Type Initial  Referral From Chaplain  Consult/Referral To Chaplain  While rounding SDS waiting area, chaplain visited with Pt's wife, Malachy Mood to found out how she was doing while waiting. Malachy Mood said it was difficult for her to be here in the hospital because her mother die in [facility]. Chaplain saw tears well up in Cheryl's eyes. When chaplain felt led to get tissues, she decided not to and allowed Cheryl's tears to flow. She said her mother was 56 when she past and she was a day older than  Her father. Pt's wife shared that her and her sister gave their parent's a big birthday party. She also mentioned Pt's mother died 7 years ago around this time, therefore this is a rough time for her. Before leaving, chaplain knelt down and asked if she could pray for her. After chaplain prayed, Pt's wife thanked chaplain for talking to and listening to her.

## 2020-06-04 NOTE — Op Note (Signed)
[facility] Patient Name: David Kirby Procedure Date: 06/04/2020 8:57 AM MRN: [id number] Account #: [id number] Date of Birth: 1955/09/28 Admit Type: Outpatient Age: 64 Room: [facility] ENDO ROOM 1 Gender: Male Note Status: Finalized Procedure:             Upper GI endoscopy Indications:           Barrett's esophagus Providers:             Andrey Farmer MD, MD Referring MD:          Christena Flake. Raechel Ache, MD (Referring MD) Medicines:             Monitored Anesthesia Care Complications:         No immediate complications. Procedure:             Pre-Anesthesia Assessment:                        - Prior to the procedure, a History and Physical was                         performed, and patient medications and allergies were                         reviewed. The patient is competent. The risks and                         benefits of the procedure and the sedation options and                         risks were discussed with the patient. All questions                         were answered and informed consent was obtained.                         Patient identification and proposed procedure were                         verified by the physician, the nurse, the anesthetist                         and the technician in the endoscopy suite. Mental                         Status Examination: alert and oriented. Airway                         Examination: normal oropharyngeal airway and neck                         mobility. Respiratory Examination: clear to                         auscultation. CV Examination: normal. Prophylactic                         Antibiotics: The patient does not require prophylactic  antibiotics. Prior Anticoagulants: The patient has                         taken no previous anticoagulant or antiplatelet                         agents. ASA Grade Assessment: II - A patient with mild                         systemic  disease. After reviewing the risks and                         benefits, the patient was deemed in satisfactory                         condition to undergo the procedure. The anesthesia                         plan was to use monitored anesthesia care (MAC).                         Immediately prior to administration of medications,                         the patient was re-assessed for adequacy to receive                         sedatives. The heart rate, respiratory rate, oxygen                         saturations, blood pressure, adequacy of pulmonary                         ventilation, and response to care were monitored                         throughout the procedure. The physical status of the                         patient was re-assessed after the procedure.                        After obtaining informed consent, the endoscope was                         passed under direct vision. Throughout the procedure,                         the patient's blood pressure, pulse, and oxygen                         saturations were monitored continuously. The Endoscope                         was introduced through the mouth, and advanced to the                         second part of duodenum. The upper GI endoscopy was  accomplished without difficulty. The patient tolerated                         the procedure well. Findings:      The esophagus and gastroesophageal junction were examined with white       light and narrow band imaging (NBI). There was no visual evidence of       Barrett's esophagus.      The entire examined stomach was normal.      The examined duodenum was normal. Impression:            - There is no endoscopic evidence of Barrett's                         esophagus.                        - Normal stomach.                        - Normal examined duodenum.                        - No specimens collected. Recommendation:        - Discharge  patient to home.                        - Resume previous diet.                        - Continue present medications.                        - Return to referring physician as previously                         scheduled. Procedure Code(s):     --- Professional ---                        [phone], Esophagogastroduodenoscopy, flexible,                         transoral; diagnostic, including collection of                         specimen(s) by brushing or washing, when performed                         (separate procedure) Diagnosis Code(s):     --- Professional ---                        K22.70, Barrett's esophagus without dysplasia CPT copyright 2019 American Medical Association. All rights reserved. The codes documented in this report are preliminary and upon coder review may  be revised to meet current compliance requirements. Andrey Farmer, MD Andrey Farmer MD, MD 06/04/2020 9:25:47 AM Number of Addenda: 0 Note Initiated On: 06/04/2020 8:57 AM Estimated Blood Loss:  Estimated blood loss: none.      [facility]

## 2020-06-04 NOTE — Anesthesia Procedure Notes (Signed)
Procedure Name: General with mask airway Performed by: Fletcher-Harrison, Deanthony Maull, CRNA Pre-anesthesia Checklist: Patient identified, Emergency Drugs available, Suction available and Patient being monitored Patient Re-evaluated:Patient Re-evaluated prior to induction Oxygen Delivery Method: Simple face mask Induction Type: IV induction Placement Confirmation: positive ETCO2 and CO2 detector Dental Injury: Teeth and Oropharynx as per pre-operative assessment        

## 2020-06-04 NOTE — Interval H&P Note (Signed)
History and Physical Interval Note:  06/04/2020 8:51 AM  David Kirby  has presented today for surgery, with the diagnosis of HX ADEN POLYPS.  The various methods of treatment have been discussed with the patient and family. After consideration of risks, benefits and other options for treatment, the patient has consented to  Procedure(s): COLONOSCOPY WITH PROPOFOL (N/A) ESOPHAGOGASTRODUODENOSCOPY (EGD) WITH PROPOFOL (N/A) as a surgical intervention.  The patient's history has been reviewed, patient examined, no change in status, stable for surgery.  I have reviewed the patient's chart and labs.  Questions were answered to the patient's satisfaction.     Lesly Rubenstein  Ok to proceed with EGD/Colonoscopy

## 2020-06-05 ENCOUNTER — Encounter: Payer: Self-pay | Admitting: Gastroenterology

## 2020-06-05 LAB — SURGICAL PATHOLOGY

## 2020-08-27 ENCOUNTER — Other Ambulatory Visit: Payer: Self-pay

## 2020-08-27 ENCOUNTER — Other Ambulatory Visit: Payer: 59

## 2020-08-27 DIAGNOSIS — C61 Malignant neoplasm of prostate: Secondary | ICD-10-CM

## 2020-08-27 NOTE — Addendum Note (Signed)
Addended by: Evelina Bucy on: 08/27/2020 08:22 AM   Modules accepted: Orders

## 2020-08-28 ENCOUNTER — Ambulatory Visit (INDEPENDENT_AMBULATORY_CARE_PROVIDER_SITE_OTHER): Payer: 59 | Admitting: Urology

## 2020-08-28 ENCOUNTER — Encounter: Payer: Self-pay | Admitting: Urology

## 2020-08-28 VITALS — BP 144/81 | HR 63 | Ht 69.0 in | Wt 186.0 lb

## 2020-08-28 DIAGNOSIS — C61 Malignant neoplasm of prostate: Secondary | ICD-10-CM

## 2020-08-28 DIAGNOSIS — N400 Enlarged prostate without lower urinary tract symptoms: Secondary | ICD-10-CM | POA: Diagnosis not present

## 2020-08-28 LAB — PSA: Prostate Specific Ag, Serum: 6.3 ng/mL — ABNORMAL HIGH (ref 0.0–4.0)

## 2020-08-28 NOTE — Progress Notes (Signed)
08/28/2020 11:18 AM   David Kirby [birth year] [id number]  Referring provider: [name], MD [facility] [place],  [place] [address]  Chief Complaint  Patient presents with  . Prostate Cancer    HPI: 65 y.o. M who returns today for continued surveillance of prostate cancer.  PSA trend as below.  He had a downward decline his PSA to 4.8 in October but is back up to 6.3 which is above his previous high of 6.4 slightly less than a year ago.  MR of prostate w and w/o constrast revealedPI-RADS category 3 lesion of the right posterolateral peripheral zone in the apex, measuring 0.28 cubic cmandPI-RADS category 3 lesion of the right posterior peripheral zone and right posterior transition zone in the apex. Evidence of benign prostatic hypertrophyand no findings of locally advanced or pelvic metastatic diseasenoted.    He had a prostate bx on 12/20/19. His path report indicates1 of 12 coresinvolved low volume Gleason 3+3 up to 3% of tissue at right mid. TRUS 79 gm  No family history of prostate cancer.  He continues to remain completely asymptomatic.  He has no voiding symptoms.  PSA Trend 08/27/20, 6.3 04/26/20, 4.8 11/16/19, 6.4 03/16/2019, 5.04 10/27/2018, 4.3 06/15/18, 4.4  02/11/18, 5.01 12/21/2017, 5.48  12/2015, 2.82    PMH: Past Medical History:  Diagnosis Date  . Chronic insomnia   . GERD (gastroesophageal reflux disease)   . H/O Malignant melanoma    LEFT LEG  . Hypercholesteremia     Surgical History: Past Surgical History:  Procedure Laterality Date  . COLONOSCOPY WITH PROPOFOL N/A 04/13/2016   Procedure: COLONOSCOPY WITH PROPOFOL;  Surgeon: Lollie Sails, MD;  Location: [facility] ENDOSCOPY;  Service: Endoscopy;  Laterality: N/A;  . COLONOSCOPY WITH PROPOFOL N/A 06/04/2020   Procedure: COLONOSCOPY WITH PROPOFOL;  Surgeon: Lesly Rubenstein, MD;  Location: [facility] ENDOSCOPY;  Service: Endoscopy;  Laterality: N/A;  .  ESOPHAGOGASTRODUODENOSCOPY (EGD) WITH PROPOFOL N/A 04/13/2016   Procedure: ESOPHAGOGASTRODUODENOSCOPY (EGD) WITH PROPOFOL;  Surgeon: Lollie Sails, MD;  Location: [facility] ENDOSCOPY;  Service: Endoscopy;  Laterality: N/A;  . ESOPHAGOGASTRODUODENOSCOPY (EGD) WITH PROPOFOL N/A 06/04/2020   Procedure: ESOPHAGOGASTRODUODENOSCOPY (EGD) WITH PROPOFOL;  Surgeon: Lesly Rubenstein, MD;  Location: [facility] ENDOSCOPY;  Service: Endoscopy;  Laterality: N/A;  . EXCISION MASS LOWER EXTREMETIES Left    EXCISION MALIGNANT MELANOMA  . TONSILLECTOMY     W/ADENOIDECTOMY    Home Medications:  Allergies as of 08/28/2020      Reactions   Biaxin [clarithromycin] Other (See Comments)   G.I.UPSET   Penicillins Other (See Comments)   G.I.UPSET   Tetracyclines & Related Swelling      Medication List       Accurate as of August 28, 2020 11:18 AM. If you have any questions, ask your nurse or doctor.        aspirin 81 MG EC tablet Take by mouth.   CENTRUM SILVER PO Take by mouth.   Melatonin 10 MG Subl Place under the tongue. What changed: Another medication with the same name was removed. Continue taking this medication, and follow the directions you see here. Changed by: Hollice Espy, MD   omeprazole 20 MG capsule Commonly known as: PRILOSEC Take 20 mg by mouth daily.   pravastatin 20 MG tablet Commonly known as: PRAVACHOL Take 20 mg by mouth daily.   senna-docusate 8.6-50 MG tablet Commonly known as: Senokot-S Take by mouth.       Allergies:  Allergies  Allergen  Reactions  . Biaxin [Clarithromycin] Other (See Comments)    G.I.UPSET   . Penicillins Other (See Comments)    G.I.UPSET   . Tetracyclines & Related Swelling    Family History: No family history on file.  Social History:  reports that he has never smoked. He has never used smokeless tobacco. He reports current alcohol use of about 2.0 standard drinks of alcohol per week. He reports that he does not use  drugs.   Physical Exam: BP (!) 144/81   Pulse 63   Ht 5\' 9"  (1.753 m)   Wt 186 lb (84.4 kg)   BMI 27.47 kg/m   Constitutional:  Alert and oriented, No acute distress. HEENT: [place] AT, moist mucus membranes.  Trachea midline, no masses. Cardiovascular: No clubbing, cyanosis, or edema. Respiratory: Normal respiratory effort, no increased work of breathing. Skin: No rashes, bruises or suspicious lesions. Neurologic: Grossly intact, no focal deficits, moving all 4 extremities. Psychiatric: Normal mood and affect.   Assessment & Plan:    1. Prostate cancer [facility]) Personal history of low risk prostate cancer on active surveillance  PSA has fluctuated somewhat but within the same range which is reassuring  Given that his biopsy was less than a year ago and his PSA is essentially stable, we will continue to follow him with serial PSA at this point time.  If his PSA starts to rise above this range, consider repeat MRI versus repeat biopsy.  He is agreeable this plan.  2. BPH without obstruction/lower urinary tract symptoms Prostamegaly without obstruction or irritative voiding symptoms  Follow-up in 6 months with PSA/DRE  Hollice Espy, MD  [facility] [address], [place], [place] [address] [phone]

## 2020-09-03 ENCOUNTER — Other Ambulatory Visit: Payer: Self-pay

## 2021-02-24 ENCOUNTER — Other Ambulatory Visit: Payer: Self-pay

## 2021-02-24 DIAGNOSIS — C61 Malignant neoplasm of prostate: Secondary | ICD-10-CM

## 2021-02-25 ENCOUNTER — Other Ambulatory Visit: Payer: 59

## 2021-02-25 ENCOUNTER — Other Ambulatory Visit: Payer: Self-pay

## 2021-02-25 DIAGNOSIS — C61 Malignant neoplasm of prostate: Secondary | ICD-10-CM

## 2021-02-26 LAB — PSA: Prostate Specific Ag, Serum: 5.5 ng/mL — ABNORMAL HIGH (ref 0.0–4.0)

## 2021-03-02 NOTE — Progress Notes (Signed)
03/04/21 3:03 PM   David Kirby 11-10-1955 [id number]  Referring provider:  [name], MD [facility] [place],  [place] [address] Chief Complaint  Patient presents with   Prostate Cancer     HPI: David Kirby is a 65 y.o.male who has a personal history of low risk prostate cancer on active surveillance who presents today for 6 month follow-up with PSA and DRE.   His PSA has decline from a 6.3 to a 5.5.   MR of prostate w and w/o constrast on 05/18/2019 revealed PI-RADS category 3 lesion of the right posterolateral peripheral zone in the apex, measuring 0.28 cubic cm and PI-RADS category 3 lesion of the right posterior peripheral zone and right posterior transition zone in the apex. Evidence of benign prostatic hypertrophy and no findings of locally advanced or pelvic metastatic disease noted.     He had a prostate biopsy on 12/20/19/ Report indicated 1 or 12 cores involved low volume Gleason 3+3 up to 3% of tissue at right mid. TRUS 79 gm.   No family history of prostate cancer.   He states that he has been increasing his fluid increase which makes him get up and time more.  He gets up from 0-2 times, more if he drinks before bedtime none if he avoids beverages.  He is not particular bothered by this.  He has no daytime symptoms.  PSA trend :  Component     Latest Ref Rng & Units 06/15/2018 10/27/2018 11/16/2019 04/26/2020  Prostate Specific Ag, Serum     0.0 - 4.0 ng/mL 4.4 (H) 4.3 (H) 6.4 (H) 4.8 (H)   Component     Latest Ref Rng & Units 08/27/2020 02/25/2021  Prostate Specific Ag, Serum     0.0 - 4.0 ng/mL 6.3 (H) 5.5 (H)     PMH: Past Medical History:  Diagnosis Date   Chronic insomnia    GERD (gastroesophageal reflux disease)    H/O Malignant melanoma    LEFT LEG   Hypercholesteremia    Prostate cancer [facility])     Surgical History: Past Surgical History:  Procedure Laterality Date   COLONOSCOPY WITH PROPOFOL N/A 04/13/2016    Procedure: COLONOSCOPY WITH PROPOFOL;  Surgeon: Lollie Sails, MD;  Location: [facility] ENDOSCOPY;  Service: Endoscopy;  Laterality: N/A;   COLONOSCOPY WITH PROPOFOL N/A 06/04/2020   Procedure: COLONOSCOPY WITH PROPOFOL;  Surgeon: Lesly Rubenstein, MD;  Location: [facility] ENDOSCOPY;  Service: Endoscopy;  Laterality: N/A;   ESOPHAGOGASTRODUODENOSCOPY (EGD) WITH PROPOFOL N/A 04/13/2016   Procedure: ESOPHAGOGASTRODUODENOSCOPY (EGD) WITH PROPOFOL;  Surgeon: Lollie Sails, MD;  Location: [facility] ENDOSCOPY;  Service: Endoscopy;  Laterality: N/A;   ESOPHAGOGASTRODUODENOSCOPY (EGD) WITH PROPOFOL N/A 06/04/2020   Procedure: ESOPHAGOGASTRODUODENOSCOPY (EGD) WITH PROPOFOL;  Surgeon: Lesly Rubenstein, MD;  Location: [facility] ENDOSCOPY;  Service: Endoscopy;  Laterality: N/A;   EXCISION MASS LOWER EXTREMETIES Left    EXCISION MALIGNANT MELANOMA   TONSILLECTOMY     W/ADENOIDECTOMY    Home Medications:  Allergies as of 03/04/2021       Reactions   Biaxin [clarithromycin] Other (See Comments)   G.I.UPSET   Penicillins Other (See Comments)   G.I.UPSET   Tetracyclines & Related Swelling        Medication List        Accurate as of March 04, 2021  3:03 PM. If you have any questions, ask your nurse or doctor.          aspirin 81  MG EC tablet Take by mouth.   CENTRUM SILVER PO Take by mouth.   Melatonin 10 MG Subl Place under the tongue.   omeprazole 20 MG capsule Commonly known as: PRILOSEC Take 20 mg by mouth daily.   pravastatin 20 MG tablet Commonly known as: PRAVACHOL Take 20 mg by mouth daily.   senna-docusate 8.6-50 MG tablet Commonly known as: Senokot-S Take by mouth.        Allergies:  Allergies  Allergen Reactions   Biaxin [Clarithromycin] Other (See Comments)    G.I.UPSET    Penicillins Other (See Comments)    G.I.UPSET    Tetracyclines & Related Swelling    Family History: History reviewed. No pertinent family history.  Social History:  reports that he  has never smoked. He has never used smokeless tobacco. He reports current alcohol use of about 2.0 standard drinks per week. He reports that he does not use drugs.   Physical Exam: BP (!) 152/82   Pulse 65   Ht '5\' 9"'$  (1.753 m)   Wt 190 lb (86.2 kg)   BMI 28.06 kg/m   Constitutional:  Alert and oriented, No acute distress. HEENT: [place] AT, moist mucus membranes.  Trachea midline, no masses. Cardiovascular: No clubbing, cyanosis, or edema. Respiratory: Normal respiratory effort, no increased work of breathing. Rectal: Normal sphincter tone,  50+  CC prostate, smooth no nodules, rubbery lateral lobes. Skin: No rashes, bruises or suspicious lesions. Neurologic: Grossly intact, no focal deficits, moving all 4 extremities. Psychiatric: Normal mood and affect.  Laboratory Data:  Lab Results  Component Value Date   CREATININE 0.80 05/08/2019      Assessment & Plan:    Prostate cancer [facility]) - Personal history of low risk prostate cancer on active surveillance   - Recent PSA 5.5, continue PSA testing every 6 months   - His PSA is essentially stable /rectal exam benign and unchanged  BPH without obstruction/lower urinary tract symptoms  - Prostamegaly without obstruction or irritative voiding symptoms   3. Urinary Frequency - Mild   - Discussed monitoring frequency and to return if symptoms worsen   Follow-up with PSA results in 6 months and check-up in a year with PSA/DRE  Conley Rolls as a scribe for Hollice Espy, MD.,have documented all relevant documentation on the behalf of Hollice Espy, MD,as directed by  Hollice Espy, MD while in the presence of Hollice Espy, MD.  I have reviewed the above documentation for accuracy and completeness, and I agree with the above.   Hollice Espy, MD  [facility] [address], [place], [place] [address] [phone]

## 2021-03-04 ENCOUNTER — Encounter: Payer: Self-pay | Admitting: Urology

## 2021-03-04 ENCOUNTER — Other Ambulatory Visit: Payer: Self-pay

## 2021-03-04 ENCOUNTER — Ambulatory Visit (INDEPENDENT_AMBULATORY_CARE_PROVIDER_SITE_OTHER): Payer: 59 | Admitting: Urology

## 2021-03-04 VITALS — BP 152/82 | HR 65 | Ht 69.0 in | Wt 190.0 lb

## 2021-03-04 DIAGNOSIS — C61 Malignant neoplasm of prostate: Secondary | ICD-10-CM | POA: Diagnosis not present

## 2021-09-04 ENCOUNTER — Other Ambulatory Visit: Payer: 59

## 2021-09-04 ENCOUNTER — Other Ambulatory Visit: Payer: Self-pay

## 2021-09-04 DIAGNOSIS — C61 Malignant neoplasm of prostate: Secondary | ICD-10-CM

## 2021-09-05 LAB — PSA: Prostate Specific Ag, Serum: 6.9 ng/mL — ABNORMAL HIGH (ref 0.0–4.0)

## 2021-09-16 IMAGING — MR MR PROSTATE WO/W CM
56 series · 56 of 56 positions shown · IV contrast (8ml Gadavist)
Comparison: None.

CLINICAL DATA: Elevated PSA level of 5.0 in January 2019.

EXAM:
MR PROSTATE WITHOUT AND WITH CONTRAST
TECHNIQUE: Multiplanar multisequence MRI images were obtained of the pelvis
centered about the prostate. Pre and post contrast images were
obtained.
CONTRAST:  8mL GADAVIST GADOBUTROL 1 MMOL/ML IV SOLN

[Series 3: T1 · axial · 8.0mm · 0.74mm/px · 1 of 25 slices shown (1 of 46)]
[im 1/25]
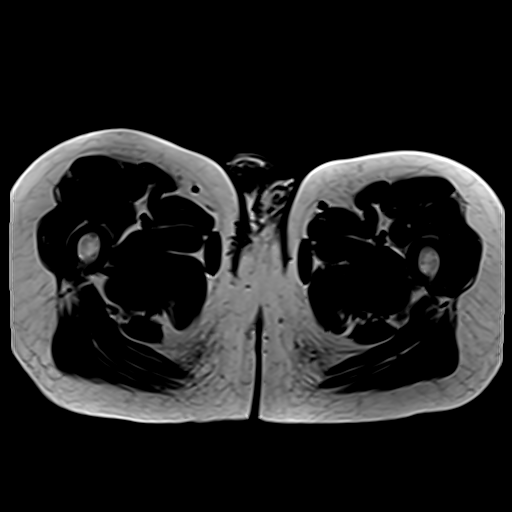

[Series 4: bSSFP fat-sat · axial · 8.0mm · 0.74mm/px · 1 of 25 slices shown]
[im 1/25]
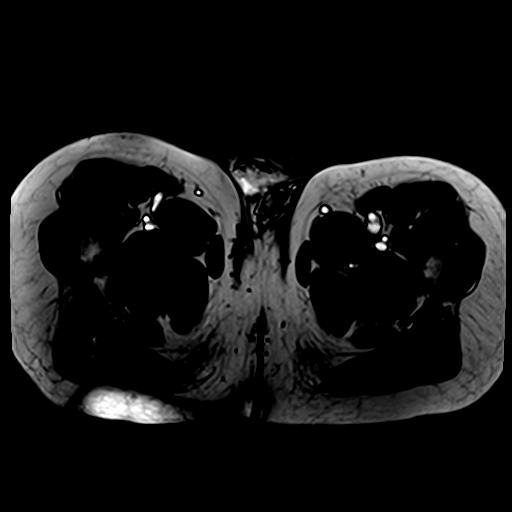

[Series 5: T2 · coronal · 3.0mm · 0.70mm/px · 1 of 30 slices shown (1 of 3)]
[im 1/30]
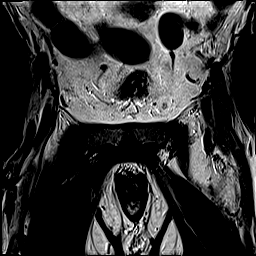

[Series 6: T2 · sagittal · 3.5mm · 0.62mm/px · 1 of 35 slices shown (2 of 3)]
[im 1/35]
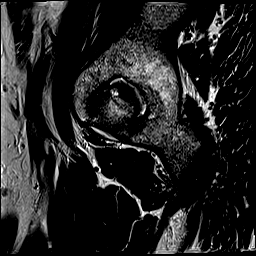

[Series 7: T1 · axial · 3.0mm · 0.35mm/px · 1 of 30 slices shown (2 of 46)]
[im 1/30]
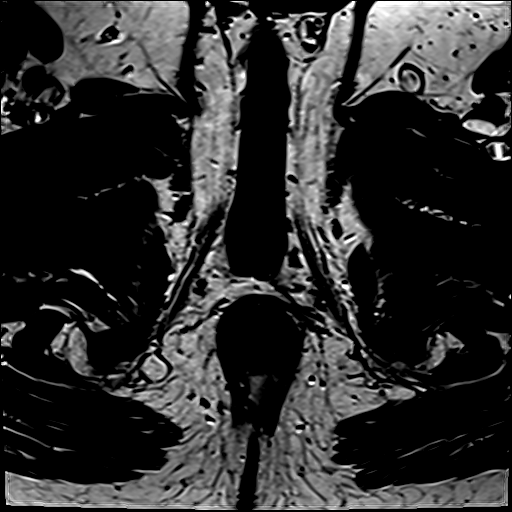

[Series 8: T2 · axial · 3.5mm · 0.56mm/px · 1 of 23 slices shown (3 of 3)]
[im 1/23]
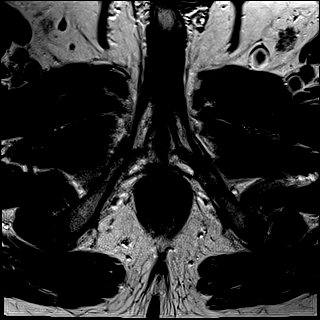

[Series 9: t2_space_tra (id) · axial · 1.0mm · 1.04mm/px · 1 of 80 slices shown]
[im 1/80]
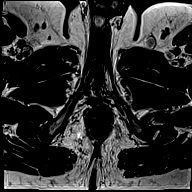

[Series 10: ax dwi_tracew · axial · 3.0mm · 0.78mm/px · 1 of 25 slices shown (1 of 3)]
[im 1/25]
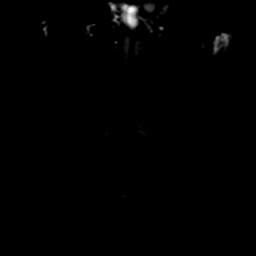

[Series 10: ax dwi_tracew · axial · 3.0mm · 0.78mm/px · 1 of 25 slices shown (2 of 3)]
[im 1/25]
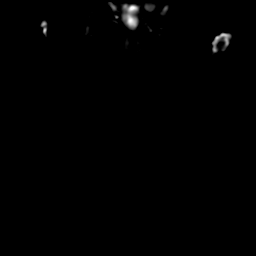

[Series 10: ax dwi_tracew · axial · 3.0mm · 0.78mm/px · 1 of 25 slices shown (3 of 3)]
[im 1/25]
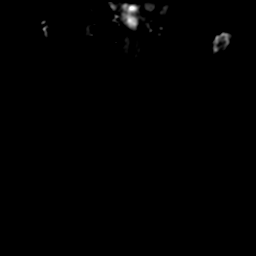

[Series 11: ax dwi_adc · axial · 3.0mm · 0.78mm/px · 1 of 25 slices shown]
[im 1/25]
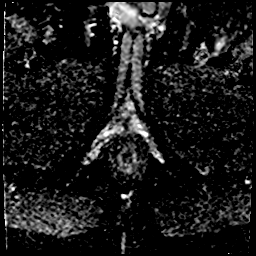

[Series 12: ax dwi_calc_bval · axial · 3.0mm · 0.78mm/px · 1 of 23 slices shown]
[im 1/23]
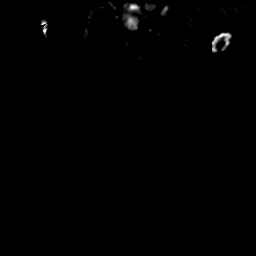

[Series 14: T1 · axial · 3.0mm · 1.15mm/px · 1 of 28 slices shown (3 of 46)]
[im 1/28]
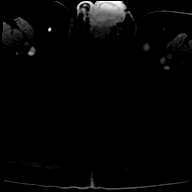

[Series 15: T1 · axial · 3.0mm · 1.15mm/px · 1 of 28 slices shown (4 of 46)]
[im 1/28]
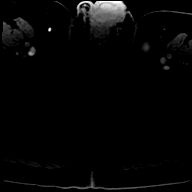

[Series 16: T1 · axial · 3.0mm · 1.15mm/px · 1 of 28 slices shown (5 of 46)]
[im 1/28]
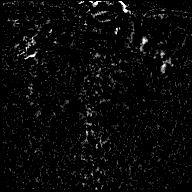

[Series 17: T1 · axial · 3.0mm · 1.15mm/px · 1 of 28 slices shown (6 of 46)]
[im 1/28]
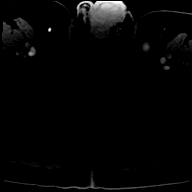

[Series 18: T1 · axial · 3.0mm · 1.15mm/px · 1 of 28 slices shown (7 of 46)]
[im 1/28]
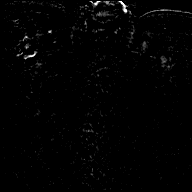

[Series 19: T1 · axial · 3.0mm · 1.15mm/px · 1 of 28 slices shown (8 of 46)]
[im 1/28]
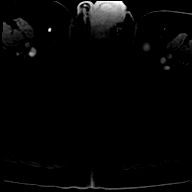

[Series 20: T1 · axial · 3.0mm · 1.15mm/px · 1 of 28 slices shown (9 of 46)]
[im 1/28]
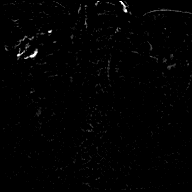

[Series 21: T1 · axial · 3.0mm · 1.15mm/px · 1 of 28 slices shown (10 of 46)]
[im 1/28]
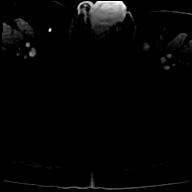

[Series 22: T1 · axial · 3.0mm · 1.15mm/px · 1 of 28 slices shown (11 of 46)]
[im 1/28]
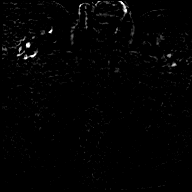

[Series 23: T1 · axial · 3.0mm · 1.15mm/px · 1 of 28 slices shown (12 of 46)]
[im 1/28]
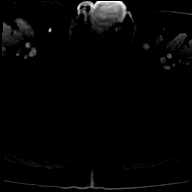

[Series 24: T1 · axial · 3.0mm · 1.15mm/px · 1 of 28 slices shown (13 of 46)]
[im 1/28]
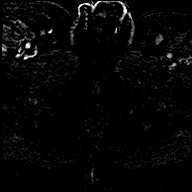

[Series 25: T1 · axial · 3.0mm · 1.15mm/px · 1 of 28 slices shown (14 of 46)]
[im 1/28]
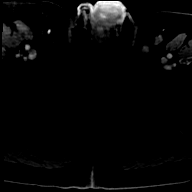

[Series 26: T1 · axial · 3.0mm · 1.15mm/px · 1 of 28 slices shown (15 of 46)]
[im 1/28]
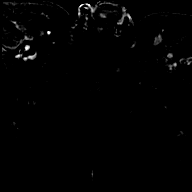

[Series 27: T1 · axial · 3.0mm · 1.15mm/px · 1 of 28 slices shown (16 of 46)]
[im 1/28]
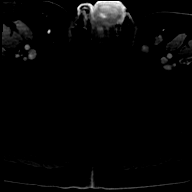

[Series 28: T1 · axial · 3.0mm · 1.15mm/px · 1 of 28 slices shown (17 of 46)]
[im 1/28]
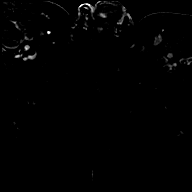

[Series 29: T1 · axial · 3.0mm · 1.15mm/px · 1 of 28 slices shown (18 of 46)]
[im 1/28]
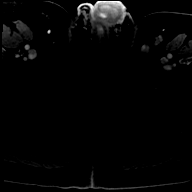

[Series 30: T1 · axial · 3.0mm · 1.15mm/px · 1 of 28 slices shown (19 of 46)]
[im 1/28]
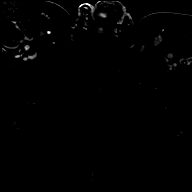

[Series 31: T1 · axial · 3.0mm · 1.15mm/px · 1 of 28 slices shown (20 of 46)]
[im 1/28]
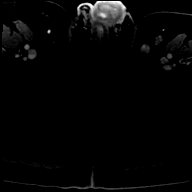

[Series 32: T1 · axial · 3.0mm · 1.15mm/px · 1 of 28 slices shown (21 of 46)]
[im 1/28]
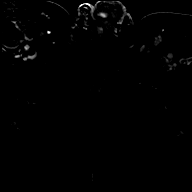

[Series 33: T1 · axial · 3.0mm · 1.15mm/px · 1 of 28 slices shown (22 of 46)]
[im 1/28]
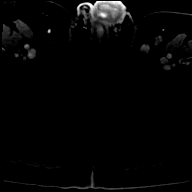

[Series 34: T1 · axial · 3.0mm · 1.15mm/px · 1 of 28 slices shown (23 of 46)]
[im 1/28]
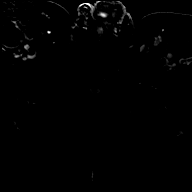

[Series 35: T1 · axial · 3.0mm · 1.15mm/px · 1 of 28 slices shown (24 of 46)]
[im 1/28]
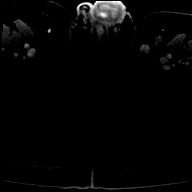

[Series 36: T1 · axial · 3.0mm · 1.15mm/px · 1 of 28 slices shown (25 of 46)]
[im 1/28]
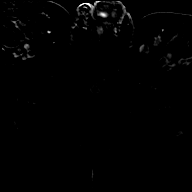

[Series 37: T1 · axial · 3.0mm · 1.15mm/px · 1 of 28 slices shown (26 of 46)]
[im 1/28]
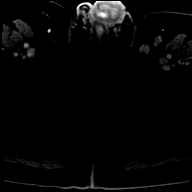

[Series 38: T1 · axial · 3.0mm · 1.15mm/px · 1 of 28 slices shown (27 of 46)]
[im 1/28]
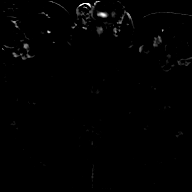

[Series 39: T1 · axial · 3.0mm · 1.15mm/px · 1 of 28 slices shown (28 of 46)]
[im 1/28]
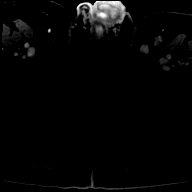

[Series 40: T1 · axial · 3.0mm · 1.15mm/px · 1 of 28 slices shown (29 of 46)]
[im 1/28]
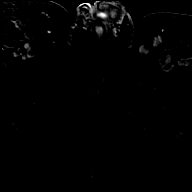

[Series 41: T1 · axial · 3.0mm · 1.15mm/px · 1 of 28 slices shown (30 of 46)]
[im 1/28]
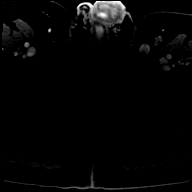

[Series 42: T1 · axial · 3.0mm · 1.15mm/px · 1 of 28 slices shown (31 of 46)]
[im 1/28]
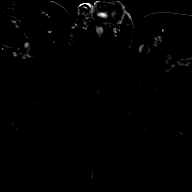

[Series 43: T1 · axial · 3.0mm · 1.15mm/px · 1 of 28 slices shown (32 of 46)]
[im 1/28]
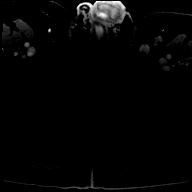

[Series 44: T1 · axial · 3.0mm · 1.15mm/px · 1 of 28 slices shown (33 of 46)]
[im 1/28]
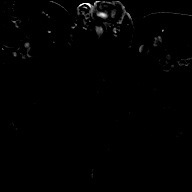

[Series 45: T1 · axial · 3.0mm · 1.15mm/px · 1 of 28 slices shown (34 of 46)]
[im 1/28]
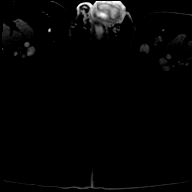

[Series 46: T1 · axial · 3.0mm · 1.15mm/px · 1 of 28 slices shown (35 of 46)]
[im 1/28]
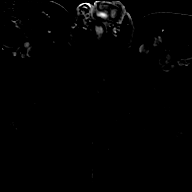

[Series 47: T1 · axial · 3.0mm · 1.15mm/px · 1 of 28 slices shown (36 of 46)]
[im 1/28]
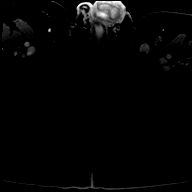

[Series 48: T1 · axial · 3.0mm · 1.15mm/px · 1 of 28 slices shown (37 of 46)]
[im 1/28]
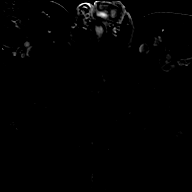

[Series 49: T1 · axial · 3.0mm · 1.15mm/px · 1 of 28 slices shown (38 of 46)]
[im 1/28]
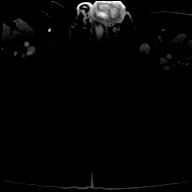

[Series 50: T1 · axial · 3.0mm · 1.15mm/px · 1 of 28 slices shown (39 of 46)]
[im 1/28]
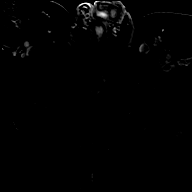

[Series 51: T1 · axial · 3.0mm · 1.15mm/px · 1 of 28 slices shown (40 of 46)]
[im 1/28]
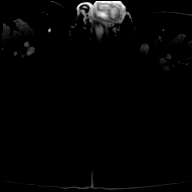

[Series 52: T1 · axial · 3.0mm · 1.15mm/px · 1 of 28 slices shown (41 of 46)]
[im 1/28]
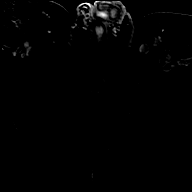

[Series 53: T1 · axial · 3.0mm · 1.15mm/px · 1 of 28 slices shown (42 of 46)]
[im 1/28]
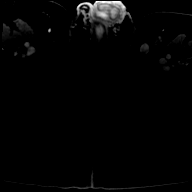

[Series 54: T1 · axial · 3.0mm · 1.15mm/px · 1 of 28 slices shown (43 of 46)]
[im 1/28]
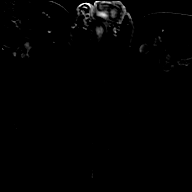

[Series 55: T1 · axial · 3.0mm · 1.15mm/px · 1 of 28 slices shown (44 of 46)]
[im 1/28]
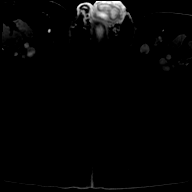

[Series 56: T1 · axial · 3.0mm · 1.15mm/px · 1 of 28 slices shown (45 of 46)]
[im 1/28]
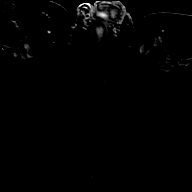

[Series 57: T1 · axial · 3.0mm · 1.15mm/px · 1 of 28 slices shown (46 of 46)]
[im 1/28]
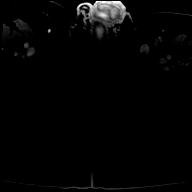

[56 of 56 positions shown; findings below may reference images not displayed]

FINDINGS: Prostate:

Region of interest # 1: PI-RADS category 3 lesion of the right
posterolateral peripheral zone in the apex, with reduced T2 signal
but with only linear accentuated enhancement rather than
well-defined focal enhancement. This lesion measures 0.28 cubic cm
(1.0 by 0.5 by 0.7 cm).

Region of interest # 2: PI-RADS category 3 lesion of the right
posterior peripheral zone and likely right posterior transition zone
with low T2 signal but no accentuated enhancement or accentuated
diffusion weighted activity. This lesion measures 0.45 cubic cm (1.3
by 0.5 by 0.7 cm).

Evidence of benign prostatic hypertrophy.

Volume: 3D volumetric analysis: Prostate volume 64.46 cubic cm (5.8
by 4.7 by 4.8 cm).

Transcapsular spread:  Absent

Seminal vesicle involvement: Absent

Neurovascular bundle involvement: Absent

Pelvic adenopathy: Absent

Bone metastasis: Absent

Other findings: No supplemental non-categorized findings.
IMPRESSION: 1. PI-RADS category 3 lesion of the right posterolateral peripheral
zone in the apex, measuring 0.28 cubic cm.
2. PI-RADS category 3 lesion of the right posterior peripheral zone
and right posterior transition zone in the apex.
3. Evidence of benign prostatic hypertrophy.
4. No findings of locally advanced or pelvic metastatic disease.

## 2022-03-05 ENCOUNTER — Other Ambulatory Visit: Payer: 59

## 2022-03-05 ENCOUNTER — Other Ambulatory Visit: Payer: Self-pay | Admitting: *Deleted

## 2022-03-05 DIAGNOSIS — C61 Malignant neoplasm of prostate: Secondary | ICD-10-CM

## 2022-03-06 LAB — PSA: Prostate Specific Ag, Serum: 5.5 ng/mL — ABNORMAL HIGH (ref 0.0–4.0)

## 2022-03-10 ENCOUNTER — Encounter: Payer: Self-pay | Admitting: Urology

## 2022-03-10 ENCOUNTER — Ambulatory Visit (INDEPENDENT_AMBULATORY_CARE_PROVIDER_SITE_OTHER): Payer: 59 | Admitting: Urology

## 2022-03-10 VITALS — BP 144/80 | HR 59 | Ht 69.0 in | Wt 197.0 lb

## 2022-03-10 DIAGNOSIS — C61 Malignant neoplasm of prostate: Secondary | ICD-10-CM | POA: Diagnosis not present

## 2022-03-10 NOTE — Progress Notes (Signed)
03/10/22 11:41 AM   David Kirby [birth year] [id number]  Referring provider:  No referring provider defined for this encounter. Chief Complaint  Patient presents with   Prostate Cancer    1 year follow up    HPI: David Kirby is a 66 y.o.male  with a personal history of low risk prostate cancer on active surveillance who presents today for 6 month follow-up with PSA and DRE.    His PSA has declined from 6/9 on 09/04/21 to 5.5 on 03/05/22. PSA trend below.    MR of prostate w and w/o constrast on 05/18/2019 revealed PI-RADS category 3 lesion of the right posterolateral peripheral zone in the apex, measuring 0.28 cubic cm and PI-RADS category 3 lesion of the right posterior peripheral zone and right posterior transition zone in the apex. Evidence of benign prostatic hypertrophy and no findings of locally advanced or pelvic metastatic disease noted.      He had a prostate biopsy on 12/20/19/ Report indicated 1 or 12 cores involved low volume Gleason 3+3 up to 3% of tissue at right mid. TRUS 79 gm.    No family history of prostate cancer.   He is doing well today with no new urinary complaints.   Occasional weak stream.    PSA Trend:  Prostate Specific Ag, Serum  Latest Ref Rng 0.0 - 4.0 ng/mL  06/15/2018 4.4 (H)   10/27/2018 4.3 (H)   11/16/2019 6.4 (H)   04/26/2020 4.8 (H)   08/27/2020 6.3 (H)   02/25/2021 5.5 (H)   09/04/2021 6.9 (H)   03/05/2022 5.5 (H)     Legend: (H) High   PMH: Past Medical History:  Diagnosis Date   Chronic insomnia    GERD (gastroesophageal reflux disease)    H/O Malignant melanoma    LEFT LEG   Hypercholesteremia    Prostate cancer [facility])     Surgical History: Past Surgical History:  Procedure Laterality Date   COLONOSCOPY WITH PROPOFOL N/A 04/13/2016   Procedure: COLONOSCOPY WITH PROPOFOL;  Surgeon: Lollie Sails, MD;  Location: [facility] ENDOSCOPY;  Service: Endoscopy;  Laterality: N/A;   COLONOSCOPY WITH PROPOFOL N/A 06/04/2020    Procedure: COLONOSCOPY WITH PROPOFOL;  Surgeon: Lesly Rubenstein, MD;  Location: [facility] ENDOSCOPY;  Service: Endoscopy;  Laterality: N/A;   ESOPHAGOGASTRODUODENOSCOPY (EGD) WITH PROPOFOL N/A 04/13/2016   Procedure: ESOPHAGOGASTRODUODENOSCOPY (EGD) WITH PROPOFOL;  Surgeon: Lollie Sails, MD;  Location: [facility] ENDOSCOPY;  Service: Endoscopy;  Laterality: N/A;   ESOPHAGOGASTRODUODENOSCOPY (EGD) WITH PROPOFOL N/A 06/04/2020   Procedure: ESOPHAGOGASTRODUODENOSCOPY (EGD) WITH PROPOFOL;  Surgeon: Lesly Rubenstein, MD;  Location: [facility] ENDOSCOPY;  Service: Endoscopy;  Laterality: N/A;   EXCISION MASS LOWER EXTREMETIES Left    EXCISION MALIGNANT MELANOMA   TONSILLECTOMY     W/ADENOIDECTOMY    Home Medications:  Allergies as of 03/10/2022       Reactions   Biaxin [clarithromycin] Other (See Comments)   G.I.UPSET   Penicillins Other (See Comments)   G.I.UPSET   Tetracyclines & Related Swelling        Medication List        Accurate as of March 10, 2022 11:41 AM. If you have any questions, ask your nurse or doctor.          aspirin EC 81 MG tablet Take by mouth.   CENTRUM SILVER PO Take by mouth.   Melatonin 10 MG Subl Place under the tongue.   omeprazole 20 MG capsule Commonly known as: PRILOSEC Take 20  mg by mouth daily.   pravastatin 20 MG tablet Commonly known as: PRAVACHOL Take 20 mg by mouth daily.   senna-docusate 8.6-50 MG tablet Commonly known as: Senokot-S Take by mouth.        Allergies:  Allergies  Allergen Reactions   Biaxin [Clarithromycin] Other (See Comments)    G.I.UPSET    Penicillins Other (See Comments)    G.I.UPSET    Tetracyclines & Related Swelling    Family History: No family history on file.  Social History:  reports that he has never smoked. He has never used smokeless tobacco. He reports current alcohol use of about 2.0 standard drinks of alcohol per week. He reports that he does not use drugs.   Physical Exam: BP (!)  144/80   Pulse (!) 59   Ht '5\' 9"'$  (1.753 m)   Wt 197 lb (89.4 kg)   BMI 29.09 kg/m   Constitutional:  Alert and oriented, No acute distress. HEENT: [place] AT, moist mucus membranes.  Trachea midline, no masses. Cardiovascular: No clubbing, cyanosis, or edema. Respiratory: Normal respiratory effort, no increased work of breathing. Rectal: Normal sphincter tone,  50  CC prostate, smooth no nodules slight asymmetry left side larger than right  Skin: No rashes, bruises or suspicious lesions. Neurologic: Grossly intact, no focal deficits, moving all 4 extremities. Psychiatric: Normal mood and affect.  Laboratory Data:  Lab Results  Component Value Date   CREATININE 0.80 05/08/2019    Assessment & Plan:    Prostate cancer [facility]) - Personal history of low risk prostate cancer on active surveillance    - Recent PSA 5.5, continue PSA testing every 6 months    - His PSA is essentially stable /rectal exam benign and unchanged    Return in about 1 year (around 03/11/2023) for 57moPSA only, 1 year w/PSA prior.  IConley Rollsas a sEducation administratorfor AHollice Espy MD.,have documented all relevant documentation on the behalf of AHollice Espy MD,as directed by  AHollice Espy MD while in the presence of AHollice Espy MD.  I have reviewed the above documentation for accuracy and completeness, and I agree with the above.   AHollice Espy MD   [facility] [address] [place] [phone]

## 2022-09-10 ENCOUNTER — Other Ambulatory Visit: Payer: 59

## 2023-01-25 ENCOUNTER — Encounter: Payer: Self-pay | Admitting: Otolaryngology

## 2023-01-26 NOTE — Anesthesia Preprocedure Evaluation (Signed)
Anesthesia Evaluation  Patient identified by MRN, date of birth, ID band Patient awake    Reviewed: Allergy & Precautions, H&P , NPO status , Patient's Chart, lab work & pertinent test results  Airway Mallampati: II  TM Distance: <3 FB Neck ROM: Full   Comment: McGrath in room if needed, with 4 blade Dental no notable dental hx.    Pulmonary neg pulmonary ROS   Pulmonary exam normal breath sounds clear to auscultation       Cardiovascular negative cardio ROS Normal cardiovascular exam Rhythm:Regular Rate:Normal     Neuro/Psych negative neurological ROS  negative psych ROS   GI/Hepatic negative GI ROS, Neg liver ROS,GERD  ,,  Endo/Other  negative endocrine ROS    Renal/GU negative Renal ROS  negative genitourinary   Musculoskeletal negative musculoskeletal ROS (+) Arthritis ,    Abdominal   Peds negative pediatric ROS (+)  Hematology negative hematology ROS (+)   Anesthesia Other Findings Chronic insomnia  GERD (gastroesophageal reflux disease) H/O Malignant melanoma Hypercholesteremia Prostate cancer  Arthritis     Reproductive/Obstetrics negative OB ROS                             Anesthesia Physical Anesthesia Plan  ASA: 3  Anesthesia Plan: General ETT   Post-op Pain Management:    Induction: Intravenous  PONV Risk Score and Plan:   Airway Management Planned: Oral ETT  Additional Equipment:   Intra-op Plan:   Post-operative Plan: Extubation in OR  Informed Consent: I have reviewed the patients History and Physical, chart, labs and discussed the procedure including the risks, benefits and alternatives for the proposed anesthesia with the patient or authorized representative who has indicated his/her understanding and acceptance.     Dental Advisory Given  Plan Discussed with: Anesthesiologist, CRNA and Surgeon  Anesthesia Plan Comments: (Patient consented for  risks of anesthesia including but not limited to:  - adverse reactions to medications - damage to eyes, teeth, lips or other oral mucosa - nerve damage due to positioning  - sore throat or hoarseness - Damage to heart, brain, nerves, lungs, other parts of body or loss of life  Patient voiced understanding.)        Anesthesia Quick Evaluation

## 2023-01-28 ENCOUNTER — Encounter
Admission: RE | Disposition: A | Discharge status: Home / Self Care | Payer: Self-pay | Source: Home / Self Care | Attending: Otolaryngology

## 2023-01-28 ENCOUNTER — Ambulatory Visit: Payer: Medicare Other | Admitting: Anesthesiology

## 2023-01-28 ENCOUNTER — Ambulatory Visit
Admission: RE | Admit: 2023-01-28 | Discharge: 2023-01-28 | Disposition: A | Discharge status: Home / Self Care | Payer: Medicare Other | Attending: Otolaryngology | Admitting: Otolaryngology

## 2023-01-28 ENCOUNTER — Encounter: Payer: Self-pay | Admitting: Otolaryngology

## 2023-01-28 ENCOUNTER — Other Ambulatory Visit: Payer: Self-pay

## 2023-01-28 DIAGNOSIS — D1039 Benign neoplasm of other parts of mouth: Secondary | ICD-10-CM | POA: Insufficient documentation

## 2023-01-28 DIAGNOSIS — J301 Allergic rhinitis due to pollen: Secondary | ICD-10-CM | POA: Insufficient documentation

## 2023-01-28 DIAGNOSIS — Z79899 Other long term (current) drug therapy: Secondary | ICD-10-CM | POA: Insufficient documentation

## 2023-01-28 HISTORY — DX: Failed or difficult intubation, initial encounter: T88.4XXA

## 2023-01-28 HISTORY — DX: Unspecified osteoarthritis, unspecified site: M19.90

## 2023-01-28 HISTORY — PX: EXCISION ORAL TUMOR: SHX6265

## 2023-01-28 SURGERY — EXCISION, NEOPLASM, MOUTH
Anesthesia: General | Site: Mouth | Laterality: Bilateral

## 2023-01-28 MED ORDER — MIDAZOLAM HCL 5 MG/5ML IJ SOLN
INTRAMUSCULAR | Status: DC | PRN
  Administered 2023-01-28: 2 mg via INTRAVENOUS

## 2023-01-28 MED ORDER — LACTATED RINGERS IV SOLN: INTRAVENOUS | Status: DC

## 2023-01-28 MED ORDER — SUCCINYLCHOLINE CHLORIDE 200 MG/10ML IV SOSY
PREFILLED_SYRINGE | INTRAVENOUS | Status: DC | PRN
  Administered 2023-01-28: 100 mg via INTRAVENOUS

## 2023-01-28 MED ORDER — DEXAMETHASONE SODIUM PHOSPHATE 4 MG/ML IJ SOLN
INTRAMUSCULAR | Status: DC | PRN
  Administered 2023-01-28: 8 mg via INTRAVENOUS

## 2023-01-28 MED ORDER — PROPOFOL 10 MG/ML IV BOLUS
INTRAVENOUS | Status: DC | PRN
  Administered 2023-01-28: 150 mg via INTRAVENOUS

## 2023-01-28 MED ORDER — LIDOCAINE HCL (CARDIAC) PF 100 MG/5ML IV SOSY
PREFILLED_SYRINGE | INTRAVENOUS | Status: DC | PRN
  Administered 2023-01-28: 100 mg via INTRAVENOUS

## 2023-01-28 MED ORDER — LIDOCAINE-EPINEPHRINE 1 %-1:100000 IJ SOLN
INTRAMUSCULAR | Status: DC | PRN
  Administered 2023-01-28: 2 mL

## 2023-01-28 MED ORDER — FENTANYL CITRATE (PF) 100 MCG/2ML IJ SOLN
INTRAMUSCULAR | Status: DC | PRN
  Administered 2023-01-28: 50 ug via INTRAVENOUS

## 2023-01-28 MED ORDER — ONDANSETRON HCL 4 MG/2ML IJ SOLN
INTRAMUSCULAR | Status: DC | PRN
  Administered 2023-01-28: 4 mg via INTRAVENOUS

## 2023-01-28 MED ORDER — HYDROCODONE-ACETAMINOPHEN 5-325 MG PO TABS: 1.0000 | ORAL_TABLET | ORAL | 0 refills | Status: AC | PRN

## 2023-01-28 SURGICAL SUPPLY — 23 items
BLADE SURG SZ11 CARB STEEL (BLADE) IMPLANT
COVER MAYO STAND STRL (DRAPES) ×1 IMPLANT
COVER TABLE BACK 60X90 (DRAPES) ×1 IMPLANT
CUP MEDICINE 2OZ PLAST GRAD ST (MISCELLANEOUS) ×1 IMPLANT
DRAPE SHEET LG 3/4 BI-LAMINATE (DRAPES) IMPLANT
ELECT CAUTERY NDL 2.0 MIC (NEEDLE) ×1 IMPLANT
ELECT CAUTERY NEEDLE 2.0 MIC (NEEDLE) ×1 IMPLANT
ELECT REM PT RETURN 9FT ADLT (ELECTROSURGICAL) ×1
ELECTRODE REM PT RTRN 9FT ADLT (ELECTROSURGICAL) ×1 IMPLANT
GLOVE SURG GAMMEX PI TX LF 7.5 (GLOVE) ×1 IMPLANT
KIT TURNOVER KIT A (KITS) ×1 IMPLANT
NDL HYPO 25GX1X1/2 BEV (NEEDLE) ×1 IMPLANT
NEEDLE HYPO 25GX1X1/2 BEV (NEEDLE) ×1 IMPLANT
NS IRRIG 500ML POUR BTL (IV SOLUTION) ×1 IMPLANT
PENCIL SMOKE EVACUATOR (MISCELLANEOUS) ×1 IMPLANT
SPONGE XRAY 4X4 16PLY STRL (MISCELLANEOUS) ×1 IMPLANT
STRAP BODY AND KNEE 60X3 (MISCELLANEOUS) ×1 IMPLANT
SUCTION TUBE FRAZIER 10FR DISP (SUCTIONS) IMPLANT
SUT VIC AB 4-0 RB1 27 (SUTURE) ×1
SUT VIC AB 4-0 RB1 27X BRD (SUTURE) ×1 IMPLANT
SYR 3ML LL SCALE MARK (SYRINGE) ×1 IMPLANT
TOWEL OR 17X26 4PK STRL BLUE (TOWEL DISPOSABLE) ×1 IMPLANT
TUBING SUCTION CONN 0.25 STRL (TUBING) ×1 IMPLANT

## 2023-01-28 NOTE — Anesthesia Procedure Notes (Addendum)
Procedure Name: Intubation Date/Time: 01/28/2023 11:54 AM  Performed by: Hanford Lust, [place], CRNAPre-anesthesia Checklist: Patient identified, Patient being monitored, Timeout performed, Emergency Drugs available and Suction available Patient Re-evaluated:Patient Re-evaluated prior to induction Oxygen Delivery Method: Circle system utilized Preoxygenation: Pre-oxygenation with 100% oxygen Induction Type: IV induction Ventilation: Mask ventilation without difficulty Laryngoscope Size: McGraph and 4 Grade View: Grade I Tube type: Oral Rae Tube size: 7.5 mm Number of attempts: 1 Airway Equipment and Method: Stylet Placement Confirmation: ETT inserted through vocal cords under direct vision, positive ETCO2 and breath sounds checked- equal and bilateral Secured at: 23 cm Tube secured with: Tape Dental Injury: Teeth and Oropharynx as per pre-operative assessment

## 2023-01-28 NOTE — Op Note (Signed)
01/28/2023  12:12 PM    Almetta Lovely  [id number]   Pre-Op Dx: Papillomatous soft palate growth  Post-op Dx: Papillomata's soft palate growth  Proc: Excision soft palate lesion  Surg:  Beverly Sessions Rivaldo Hineman  Anes:  GOT  EBL: Minimal  Comp: None  Findings: About a 4 mm papillomatous growth just to the left of midline on the soft palate just distal to the hard palate.  He also had a couple small vesicles more distally on the soft palate along both sides of midline just short of the uvula.  Procedure: Patient was brought to the operating room and placed in supine position.  He was given general anesthesia by oral endotracheal intubation.  Once patient was fully asleep a Dingman mouth blade was used to open up the oropharynx and hold the tongue down.  This gave good visualization of the palate.  On the soft palate just left of midline there is a papillomatous growth.  This was right at the inflection area of the palate when he retracted his palate.  It had a relatively small stalk.  2 mL of 1% Xylocaine with epi 1-100,000 was used for infiltration into the soft palate around this area.  There is also multiple small clear vesicles that were more distal on the soft palate but above the uvula.  There were 3 or 4 vesicles on each side of the midline between the major lesion and the uvula.  These areas were cauterized using electrocautery with the suction attached to this to prevent aerolysing any particles.  The cautery was superficial of the mucosa in each of these areas to eliminate those vesicles.  A 11 blade was used to incise the mucosa all the way around the lesion on the soft palate.  This was grasped and pulled away from the tissue to be able to incise mucosa.  Once the mucosa was incised the area was then electrodesiccated with electrocautery at its base.  This stopped all bleeding and helped to electrodesiccated some of the potential root area for the papillomatous growth.  The electrodesiccation  was done with the attached suction instrument to again prevent any particles from escaping.  The specimen was sent for permanent section  The patient tolerated the procedure well.  He was awakened taken to recovery room in satisfactory condition.  There were no operative complications  Dispo:   To PACU to be discharged home  Plan: He will follow-up in the office in 1 week to make sure his palate is healing well.  I have given him a few Norco pills to use for pain if necessary but otherwise can use Tylenol or ibuprofen.  Will keep this moist and can increase his diet as tolerated.  Beverly Sessions Rhythm Gubbels  01/28/2023 12:12 PM

## 2023-01-28 NOTE — Transfer of Care (Signed)
Immediate Anesthesia Transfer of Care Note  Patient: David Kirby  Procedure(s) Performed: EXCISION BENIGN LESION OF SOFT PALATE (Bilateral: Mouth)  Patient Location: PACU  Anesthesia Type: General ETT  Level of Consciousness: awake, alert  and patient cooperative  Airway and Oxygen Therapy: Patient Spontanous Breathing and Patient connected to supplemental oxygen  Post-op Assessment: Post-op Vital signs reviewed, Patient's Cardiovascular Status Stable, Respiratory Function Stable, Patent Airway and No signs of Nausea or vomiting  Post-op Vital Signs: Reviewed and stable  Complications: No notable events documented.

## 2023-01-28 NOTE — Anesthesia Postprocedure Evaluation (Signed)
Anesthesia Post Note  Patient: David Kirby  Procedure(s) Performed: EXCISION BENIGN LESION OF SOFT PALATE (Bilateral: Mouth)  Patient location during evaluation: PACU Anesthesia Type: General Level of consciousness: awake and alert Pain management: pain level controlled Vital Signs Assessment: post-procedure vital signs reviewed and stable Respiratory status: spontaneous breathing, nonlabored ventilation, respiratory function stable and patient connected to nasal cannula oxygen Cardiovascular status: blood pressure returned to baseline and stable Postop Assessment: no apparent nausea or vomiting Anesthetic complications: no   No notable events documented.   Last Vitals:  Vitals:   01/28/23 1240 01/28/23 1245  BP:    Pulse: (!) 58 66  Resp: 11 19  Temp:  36.7 C  SpO2: 99% 100%    Last Pain:  Vitals:   01/28/23 1245  TempSrc:   PainSc: 0-No pain                 [name] C Ison Wichmann

## 2023-01-28 NOTE — H&P (Signed)
H&P has been reviewed and patient reevaluated, no changes necessary. To be downloaded later.  

## 2023-03-09 ENCOUNTER — Other Ambulatory Visit: Payer: Medicare Other

## 2023-03-09 DIAGNOSIS — C61 Malignant neoplasm of prostate: Secondary | ICD-10-CM

## 2023-03-10 LAB — PSA: Prostate Specific Ag, Serum: 7.6 ng/mL — ABNORMAL HIGH (ref 0.0–4.0)

## 2023-03-16 ENCOUNTER — Ambulatory Visit (INDEPENDENT_AMBULATORY_CARE_PROVIDER_SITE_OTHER): Payer: Medicare Other | Admitting: Urology

## 2023-03-16 VITALS — BP 129/81 | HR 68 | Ht 69.0 in | Wt 191.0 lb

## 2023-03-16 DIAGNOSIS — Z8546 Personal history of malignant neoplasm of prostate: Secondary | ICD-10-CM

## 2023-03-16 DIAGNOSIS — R6889 Other general symptoms and signs: Secondary | ICD-10-CM

## 2023-03-16 DIAGNOSIS — C61 Malignant neoplasm of prostate: Secondary | ICD-10-CM

## 2023-03-16 DIAGNOSIS — R85618 Other abnormal cytological findings on specimens from anus: Secondary | ICD-10-CM | POA: Diagnosis not present

## 2023-03-16 DIAGNOSIS — R972 Elevated prostate specific antigen [PSA]: Secondary | ICD-10-CM

## 2023-03-16 NOTE — Progress Notes (Signed)
I,Amy L Pierron,acting as a scribe for Vanna Scotland, MD.,have documented all relevant documentation on the behalf of Vanna Scotland, MD,as directed by  Vanna Scotland, MD while in the presence of Vanna Scotland, MD.  03/16/2023 10:54 AM   David Kirby 1955/09/20 [id number]  Referring provider: Mickey Farber, MD [address] [place],  [place] [address]  Chief Complaint  Patient presents with   Follow-up    HPI: 67 year-old male with a personal history of low-risk prostate cancer returns today for a follow-up.  MR of prostate w and w/o constrast on 05/18/2019 revealed PI-RADS category 3 lesion of the right posterolateral peripheral zone in the apex, measuring 0.28 cubic cm and PI-RADS category 3 lesion of the right posterior peripheral zone and right posterior transition zone in the apex. Evidence of benign prostatic hypertrophy and no findings of locally advanced or pelvic metastatic disease noted.      He had a prostate biopsy on 12/20/19/ Report indicated 1 or 12 cores involved low volume Gleason 3+3 up to 3% of tissue at right mid. TRUS 79 gm.   His most recent PSA from 03/09/2023 has increased to 7.6.  He is doing well overall with no new symptoms or complaints.   PSA Trend:   Prostate Specific Ag, Serum Latest Ref Rng 0.0 - 4.0 ng/mL 06/15/2018 4.4 (H)  10/27/2018 4.3 (H)  11/16/2019 6.4 (H)  04/26/2020 4.8 (H)  08/27/2020 6.3 (H)  02/25/2021 5.5 (H)  09/04/2021 6.9 (H)  03/05/2022 5.5 (H)  03/09/2023       7.6  (H) Legend:(H) High   PMH: Past Medical History:  Diagnosis Date   Arthritis    Chronic insomnia    Difficult airway for intubation    GERD (gastroesophageal reflux disease)    H/O Malignant melanoma    LEFT LEG   Hypercholesteremia    Prostate cancer [facility])     Surgical History: Past Surgical History:  Procedure Laterality Date   COLONOSCOPY WITH PROPOFOL N/A 04/13/2016   Procedure: COLONOSCOPY WITH PROPOFOL;  Surgeon: Christena Deem, MD;   Location: [facility] ENDOSCOPY;  Service: Endoscopy;  Laterality: N/A;   COLONOSCOPY WITH PROPOFOL N/A 06/04/2020   Procedure: COLONOSCOPY WITH PROPOFOL;  Surgeon: Regis Bill, MD;  Location: [facility] ENDOSCOPY;  Service: Endoscopy;  Laterality: N/A;   ESOPHAGOGASTRODUODENOSCOPY (EGD) WITH PROPOFOL N/A 04/13/2016   Procedure: ESOPHAGOGASTRODUODENOSCOPY (EGD) WITH PROPOFOL;  Surgeon: Christena Deem, MD;  Location: [facility] ENDOSCOPY;  Service: Endoscopy;  Laterality: N/A;   ESOPHAGOGASTRODUODENOSCOPY (EGD) WITH PROPOFOL N/A 06/04/2020   Procedure: ESOPHAGOGASTRODUODENOSCOPY (EGD) WITH PROPOFOL;  Surgeon: Regis Bill, MD;  Location: [facility] ENDOSCOPY;  Service: Endoscopy;  Laterality: N/A;   EXCISION MASS LOWER EXTREMETIES Left    EXCISION MALIGNANT MELANOMA   EXCISION ORAL TUMOR Bilateral 01/28/2023   Procedure: EXCISION BENIGN LESION OF SOFT PALATE;  Surgeon: Vernie Murders, MD;  Location: [facility]ERY [facility];  Service: ENT;  Laterality: Bilateral;   TONSILLECTOMY     W/ADENOIDECTOMY    Home Medications:  Allergies as of 03/16/2023       Reactions   Biaxin [clarithromycin] Other (See Comments)   G.I.UPSET   Erythromycin Other (See Comments)   Muscle stiffness   Penicillins Other (See Comments)   G.I.UPSET   Tetracyclines & Related Swelling        Medication List        Accurate as of March 16, 2023 10:54 AM. If you have any questions, ask your nurse or doctor.  CENTRUM SILVER PO Take by mouth.   fexofenadine 180 MG tablet Commonly known as: ALLEGRA Take 180 mg by mouth daily as needed for allergies or rhinitis.   Melatonin 10 MG Subl Place under the tongue.   omeprazole 20 MG capsule Commonly known as: PRILOSEC Take 20 mg by mouth daily.   pravastatin 20 MG tablet Commonly known as: PRAVACHOL Take 20 mg by mouth daily.   senna-docusate 8.6-50 MG tablet Commonly known as: Senokot-S Take by mouth.        Allergies:  Allergies  Allergen  Reactions   Biaxin [Clarithromycin] Other (See Comments)    G.I.UPSET    Erythromycin Other (See Comments)    Muscle stiffness   Penicillins Other (See Comments)    G.I.UPSET    Tetracyclines & Related Swelling     Social History:  reports that he has never smoked. He has never used smokeless tobacco. He reports that he does not currently use alcohol after a past usage of about 2.0 standard drinks of alcohol per week. He reports that he does not use drugs.   Physical Exam: BP 129/81   Pulse 68   Ht 5\' 9"  (1.753 m)   Wt 191 lb (86.6 kg)   BMI 28.21 kg/m   Constitutional:  Alert and oriented, No acute distress. HEENT: [place] AT, moist mucus membranes.  Trachea midline, no masses. GU: Prostate nodule on left base. Hemorrhoids.  Neurologic: Grossly intact, no focal deficits, moving all 4 extremities. Psychiatric: Normal mood and affect.  Assessment & Plan:     History of prostate cancer  - There has been a fluctuation in his PSA however overall trend upwards -In light of abnormal rectal exam as well as rising PSA, we will proceed with prostate MRI for comparison from the one in 2020. Depending on the results may consider repeat prostate biopsy. Will call him with the results.  -We discussed options today at length including possible need for fusion biopsy depending result.  He is agreeable to this plan understands risk and benefits.  2. Abnormal rectal exam  - Nodule present, previously appreciated asymmetry, today there feels like there may be a nodule   Call with MRI results, consider fusion if high-grade lesion  I have reviewed the above documentation for accuracy and completeness, and I agree with the above.   Vanna Scotland, MD    [facility] [address] [place], [place] [address] [phone]

## 2023-03-25 ENCOUNTER — Ambulatory Visit
Admission: RE | Admit: 2023-03-25 | Discharge: 2023-03-25 | Disposition: A | Discharge status: Home / Self Care | Payer: Medicare Other | Source: Ambulatory Visit | Attending: Urology | Admitting: Urology

## 2023-03-25 DIAGNOSIS — R972 Elevated prostate specific antigen [PSA]: Secondary | ICD-10-CM | POA: Diagnosis present

## 2023-03-25 DIAGNOSIS — C61 Malignant neoplasm of prostate: Secondary | ICD-10-CM | POA: Diagnosis present

## 2023-03-25 DIAGNOSIS — R6889 Other general symptoms and signs: Secondary | ICD-10-CM | POA: Diagnosis present

## 2023-03-25 MED ORDER — GADOBUTROL 1 MMOL/ML IV SOLN
8.0000 mL | Freq: Once | INTRAVENOUS | Status: AC | PRN
  Administered 2023-03-25: 8 mL via INTRAVENOUS

## 2023-03-30 ENCOUNTER — Other Ambulatory Visit: Payer: Self-pay

## 2023-03-30 DIAGNOSIS — C61 Malignant neoplasm of prostate: Secondary | ICD-10-CM

## 2023-09-24 ENCOUNTER — Other Ambulatory Visit: Payer: Self-pay

## 2023-09-24 ENCOUNTER — Other Ambulatory Visit: Payer: Medicare Other

## 2023-09-24 DIAGNOSIS — C61 Malignant neoplasm of prostate: Secondary | ICD-10-CM

## 2023-10-01 ENCOUNTER — Other Ambulatory Visit

## 2023-10-01 DIAGNOSIS — C61 Malignant neoplasm of prostate: Secondary | ICD-10-CM

## 2023-10-02 LAB — PSA: Prostate Specific Ag, Serum: 10.1 ng/mL — ABNORMAL HIGH (ref 0.0–4.0)

## 2023-10-06 ENCOUNTER — Encounter: Payer: Self-pay | Admitting: Urology

## 2023-10-06 ENCOUNTER — Other Ambulatory Visit: Payer: Self-pay

## 2023-10-06 DIAGNOSIS — C61 Malignant neoplasm of prostate: Secondary | ICD-10-CM

## 2023-11-15 ENCOUNTER — Other Ambulatory Visit

## 2023-11-15 DIAGNOSIS — C61 Malignant neoplasm of prostate: Secondary | ICD-10-CM

## 2023-11-16 LAB — PSA: Prostate Specific Ag, Serum: 8.3 ng/mL — ABNORMAL HIGH (ref 0.0–4.0)

## 2023-11-17 ENCOUNTER — Ambulatory Visit: Admitting: Urology

## 2023-11-17 VITALS — BP 144/91 | HR 59 | Ht 69.0 in | Wt 194.0 lb

## 2023-11-17 DIAGNOSIS — C61 Malignant neoplasm of prostate: Secondary | ICD-10-CM | POA: Diagnosis not present

## 2023-11-17 DIAGNOSIS — R972 Elevated prostate specific antigen [PSA]: Secondary | ICD-10-CM

## 2023-11-17 NOTE — Progress Notes (Signed)
 [name],acting as a scribe for David Gimenez, MD.,have documented all relevant documentation on the behalf of David Gimenez, MD,as directed by  David Gimenez, MD while in the presence of David Gimenez, MD.  11/17/23 3:43 PM   David Kirby 04-16-1956 [id number]  Referring provider: Deliah Fells, NP [address] [place],  [place] [address]  Chief Complaint  Patient presents with   Elevated PSA    HPI:  68 year-old male with history of low-risk prostate cancer presents today for follow-up. He has been on active surveillance for his prostate cancer.   MR of prostate w and w/o contrast on 05/18/2019 revealed PI-RADS category 3 lesion of the right posterolateral peripheral zone in the apex, measuring 0.28 cubic cm and PI-RADS category 3 lesion of the right posterior peripheral zone and right posterior transition zone in the apex. Evidence of benign prostatic hypertrophy and no findings of locally advanced or pelvic metastatic disease noted.      He had a prostate biopsy on 12/20/19. Report indicated 1 or 12 cores involved low volume Gleason 3+3 up to 3% of tissue at right mid. TRUS 79 gm.   His PSA levels have fluctuated, with a recent increase to 10.1 on 10/01/2023, then decreasing to 8.3 on 11/15/2023, which is still higher than the 7.6 recorded on 8.20/2024. An MRI revealed a PI-RADS 3 lesion, which is considered an equivocal lesion with a 30-40% chance of being clinically significant prostate cancer. He is aware of the potential need for a re-biopsy due to the lesion and the fluctuating PSA levels.    Prostate Specific Ag, Serum  Latest Ref Rng 0.0 - 4.0 ng/mL  11/16/2019 6.4 (H)   04/26/2020 4.8 (H)   08/27/2020 6.3 (H)   02/25/2021 5.5 (H)   09/04/2021 6.9 (H)   03/05/2022 5.5 (H)   03/09/2023 7.6 (H)   10/01/2023 10.1 (H)   11/15/2023 8.3 (H)      PMH: Past Medical History:  Diagnosis Date   Arthritis    Chronic insomnia    Difficult airway for intubation     GERD (gastroesophageal reflux disease)    H/O Malignant melanoma    LEFT LEG   Hypercholesteremia    Prostate cancer [facility])     Surgical History: Past Surgical History:  Procedure Laterality Date   COLONOSCOPY WITH PROPOFOL  N/A 04/13/2016   Procedure: COLONOSCOPY WITH PROPOFOL ;  Surgeon: Deveron Fly, MD;  Location: [facility] ENDOSCOPY;  Service: Endoscopy;  Laterality: N/A;   COLONOSCOPY WITH PROPOFOL  N/A 06/04/2020   Procedure: COLONOSCOPY WITH PROPOFOL ;  Surgeon: Shane Darling, MD;  Location: [facility] ENDOSCOPY;  Service: Endoscopy;  Laterality: N/A;   ESOPHAGOGASTRODUODENOSCOPY (EGD) WITH PROPOFOL  N/A 04/13/2016   Procedure: ESOPHAGOGASTRODUODENOSCOPY (EGD) WITH PROPOFOL ;  Surgeon: Deveron Fly, MD;  Location: [facility] ENDOSCOPY;  Service: Endoscopy;  Laterality: N/A;   ESOPHAGOGASTRODUODENOSCOPY (EGD) WITH PROPOFOL  N/A 06/04/2020   Procedure: ESOPHAGOGASTRODUODENOSCOPY (EGD) WITH PROPOFOL ;  Surgeon: Shane Darling, MD;  Location: [facility] ENDOSCOPY;  Service: Endoscopy;  Laterality: N/A;   EXCISION MASS LOWER EXTREMETIES Left    EXCISION MALIGNANT MELANOMA   EXCISION ORAL TUMOR Bilateral 01/28/2023   Procedure: EXCISION BENIGN LESION OF SOFT PALATE;  Surgeon: Juengel, Paul, MD;  Location: [facility] SUR[facility]ERY [facility];  Service: ENT;  Laterality: Bilateral;   TONSILLECTOMY     W/ADENOIDECTOMY    Home Medications:  Allergies as of 11/17/2023       Reactions   Biaxin [clarithromycin] Other (See Comments)   G.I.UPSET  Erythromycin Other (See Comments)   Muscle stiffness   Penicillins Other (See Comments)   G.I.UPSET   Tetracyclines & Related Swelling        Medication List        Accurate as of November 17, 2023  3:43 PM. If you have any questions, ask your nurse or doctor.          CENTRUM SILVER PO Take by mouth.   fexofenadine 180 MG tablet Commonly known as: ALLEGRA Take 180 mg by mouth daily as needed for allergies or rhinitis.   Melatonin 10 MG Subl Place  under the tongue.   omeprazole 20 MG capsule Commonly known as: PRILOSEC Take 20 mg by mouth daily.   pravastatin 20 MG tablet Commonly known as: PRAVACHOL Take 20 mg by mouth daily.   senna-docusate 8.6-50 MG tablet Commonly known as: Senokot-S Take by mouth.        Allergies:  Allergies  Allergen Reactions   Biaxin [Clarithromycin] Other (See Comments)    G.I.UPSET    Erythromycin Other (See Comments)    Muscle stiffness   Penicillins Other (See Comments)    G.I.UPSET    Tetracyclines & Related Swelling     Social History:  reports that he has never smoked. He has never used smokeless tobacco. He reports that he does not currently use alcohol after a past usage of about 2.0 standard drinks of alcohol per week. He reports that he does not use drugs.   Physical Exam: BP (!) 144/91   Pulse (!) 59   Ht 5\' 9"  (1.753 m)   Wt 194 lb (88 kg)   BMI 28.65 kg/m   Constitutional:  Alert and oriented, No acute distress. HEENT: [employer] AT, moist mucus membranes.  Trachea midline, no masses. Neurologic: Grossly intact, no focal deficits, moving all 4 extremities. Psychiatric: Normal mood and affect.  Pertinent Imaging: EXAM: MR PROSTATE WITHOUT AND WITH CONTRAST   TECHNIQUE: Multiplanar multisequence MRI images were obtained of the pelvis centered about the prostate. Pre and post contrast images were obtained.   CONTRAST:  8mL GADAVIST  GADOBUTROL  1 MMOL/ML IV SOLN   COMPARISON:  None Available.   FINDINGS: Prostate:   Region of interest # 1: PI-RADS category 3 lesion of the right posterolateral peripheral zone at the apex with focally reduced T2 signal (image 64, series 10) corresponding to focally reduced ADC map activity (image 23, series 8). This measures 0.26 cc (0.8 by 0.6 by 0.7 cm) is relatively similar to the prior exam.   Suspected scarring in the left posterolateral peripheral zone at the apex, considered PI-RADS category 2.   Encapsulated nodularity  in the transition zone compatible with benign prostatic hypertrophy.   Volume: 3D volumetric analysis: Prostate volume 80.7 cc (6.0 by 5.0 by 5 3 cm).   Transcapsular spread:  Absent   Seminal vesicle involvement: Absent   Neurovascular bundle involvement: Absent   Pelvic adenopathy: Absent   Bone metastasis: Absent   Other findings: No other significant findings.   IMPRESSION: 1. Stable PI-RADS category 3 lesion of the right posterolateral peripheral zone at the apex. Targeting data sent to [facility]. 2. Prostatomegaly and benign prostatic hypertrophy.     Electronically Signed   By: Freida Jes M.D.   On: 03/29/2023 15:22  This was personally reviewed and I agree with the radiologic interpretation.  Assessment & Plan:    1. Low-risk prostate cancer with PI-RADS 3 lesion - He is on active surveillance for low-risk prostate cancer. Recent  MRI findings show a PI-RADS 3 lesion, which is equivocal. The probability of clinically significant prostate cancer is 30-40%. PSA levels have increased recently, warranting further investigation. - Schedule a fusion biopsy to target the PI-RADS 3 lesion. This will involve using MRI and real-time ultrasound to create a composite image for precise targeting. A 12-core biopsy will also be performed.  - The biopsy will be conducted in [place], where the necessary equipment is available. If the provider is unavailable, Dr. Estanislao Heimlich will perform the procedure.  - Prescribe Valium for anxiety management prior to the biopsy.  2. Elevated PSA levels - His PSA levels have increased from 7.6 to 8.3, indicating a need for further evaluation. - Monitor PSA levels closely and reassess after the fusion biopsy results.  - Consider potential treatment options based on biopsy findings, including prostate removal or radiation therapy if necessary.  Return for fusion biopsy in [place].  I have reviewed the above documentation for accuracy and  completeness, and I agree with the above.   David Gimenez, MD    [facility] [address], [address] [place], [place] [address] [phone]

## 2023-11-17 NOTE — Patient Instructions (Signed)
Prostate Biopsy Instructions  Stop all aspirin or blood thinners (aspirin, plavix, coumadin, warfarin, motrin, ibuprofen, advil, aleve, naproxen, naprosyn) for 7 days prior to the procedure.  If you have any questions about stopping these medications, please contact your primary care physician or cardiologist.  Having a light meal prior to the procedure is recommended.  If you are diabetic or have low blood sugar please bring a small snack or glucose tablet.  A Fleets enema is needed to be purchased over the counter at a local pharmacy and used 2 hours before you scheduled appointment.  This can be purchased over the counter at any pharmacy.  Antibiotics will be administered in the clinic at the time of the procedure unless otherwise specified.    Please bring someone with you to the procedure to drive you home.  A follow up appointment has been scheduled for you to receive the results of the biopsy.  If you have any questions or concerns, please feel free to call the office at [phone] or send a Mychart message.    Thank you, Staff at [facility]   Transrectal Prostate Biopsy Patient Education and Post Procedure Instructions       -Definition A prostate biopsy is the removal of a small amount of tissue from the prostate gland. The tissue is examined to determine whether there is cancer.   -Reasons for Procedure A prostate biopsy is usually done after an abnormal finding by: Digital rectal exam Prostate specific antigen (PSA) blood test A prostate biopsy is the only way to find out if cancer cells are present.   -Possible Complications Problems from the procedure are rare, but all procedures have some risk including: Infection Bruising or lengthy bleeding from the rectum, or in urine or semen Difficulty urinating Reactions to anesthesia Factors that may increase the risk of complications include: Smoking History of bleeding disorders or easy bruising Use of  any medications, over-the-counter medications, or herbal supplements Sensitivity or allergy to latex, medications, or anesthesia.   -Prior to Procedure Talk to your doctor about your medications. Blood thinning medications including aspirin should be stopped 1 week prior to procedure. If prescribed by your cardiologist we may need approval before stopping medications. Use a Fleets enema 2 hours before the procedure. Can be purchased at your pharmacy. Antibiotics will be administered in the clinic prior to procedure.  Please make sure you eat a light meal prior to coming in for your appointment. This can help prevent lightheadedness during the procedure and upset stomach from antibiotics. Please bring someone with you to the procedure to drive you home.   -Anesthesia Transrectal biopsy: Local anesthesia--Just the area that is being operated on is numbed using an injectable anesthetic.   -Description of the Procedure Transrectal biopsy--Your doctor will insert a small ultrasound device into the rectum. This device will produce sound waves to create an image of the prostate. These images will help guide placement of the needle. Your doctor will then insert the needle through the wall of the rectum and into the prostate gland. The procedure should take approximately 15-30 minutes.   -Will It Hurt? You may have discomfort and soreness at the biopsy site. Pain and discomfort after the procedure can be managed with medications.   -Postoperative Care When you return home after the procedure, do the following to help ensure a smooth recovery: Stay hydrated. Drink plenty of fluids for the next few days. Avoid difficult physical activity the day and evening of the  procedure. Keep in mind that you may see blood in your urine, stool, or semen for several days. Resume any medications that were stopped when you are advised to do so.   After the sample is taken, it will be sent to a pathologist for  examination under a microscope. This doctor will analyze the sample for cancer. You will be scheduled for an appointment to discuss results. If cancer is present, your doctor will work with you to develop a treatment plan.     -Call Your Doctor or Seek Immediate Medical Attention It is important to monitor your recovery. Alert your doctor to any problems. If any of the following occur, call your doctor or go to the emergency room: Fever 100.5 or greater within 1 week post procedure go directly to ER Call the office for: Blood in the urine more than 1 week or in semen for more than 6 weeks post-biopsy Pain that you cannot control with the medications you have been given Pain, burning, urgency, or frequency of urination Cough, shortness of breath, or chest pain- if severe go to ER Heavy rectal bleeding or bleeding that lasts more than 1 week after the biopsy If you have any questions or concerns please contact our office at [facility]   [facility] [address] [place], [place] [address] [phone]

## 2023-11-19 MED ORDER — DIAZEPAM 10 MG PO TABS: 10.0000 mg | ORAL_TABLET | Freq: Once | ORAL | 0 refills | Status: AC

## 2024-01-07 ENCOUNTER — Ambulatory Visit: Admitting: Urology

## 2024-01-07 VITALS — BP 145/84 | HR 60

## 2024-01-07 DIAGNOSIS — Z8546 Personal history of malignant neoplasm of prostate: Secondary | ICD-10-CM

## 2024-01-07 DIAGNOSIS — C61 Malignant neoplasm of prostate: Secondary | ICD-10-CM

## 2024-01-07 DIAGNOSIS — Z2989 Encounter for other specified prophylactic measures: Secondary | ICD-10-CM

## 2024-01-07 DIAGNOSIS — R972 Elevated prostate specific antigen [PSA]: Secondary | ICD-10-CM

## 2024-01-07 DIAGNOSIS — R9389 Abnormal findings on diagnostic imaging of other specified body structures: Secondary | ICD-10-CM | POA: Diagnosis not present

## 2024-01-07 DIAGNOSIS — Z792 Long term (current) use of antibiotics: Secondary | ICD-10-CM

## 2024-01-07 MED ORDER — LEVOFLOXACIN 500 MG PO TABS
500.0000 mg | ORAL_TABLET | Freq: Once | ORAL | Status: AC
Start: 2024-01-07 — End: 2024-01-07
  Administered 2024-01-07: 500 mg via ORAL

## 2024-01-07 MED ORDER — GENTAMICIN SULFATE 40 MG/ML IJ SOLN
80.0000 mg | Freq: Once | INTRAMUSCULAR | Status: AC
Start: 2024-01-07 — End: 2024-01-07
  Administered 2024-01-07: 80 mg via INTRAMUSCULAR

## 2024-01-07 NOTE — Progress Notes (Signed)
   01/07/24  Indication: 68 year old male with a personal history of low risk prostate cancer active surveillance with rising PSA.  His last MRI in late 2024 revealed a PI-RADS 3 lesion  MRI Fusion Prostate Biopsy Procedure   Informed consent was obtained, and we discussed the risks of bleeding and infection/sepsis. A time out was performed to ensure correct patient identity.  Pre-Procedure: - Gentamicin  and levaquin  given for antibiotic prophylaxis -Prostate measured 80.7 g on MRI  Procedure: - Prostate block performed using 10 cc 1% lidocaine   - MRI fusion biopsy was performed, and 3 biopsies were taken from the ROI PIRADS3 lesion located in the right posterior lateral peripheral zone - Standard biopsies taken from sextant areas, 12 under ultrasound guidance. - Total of 15 cores taken  Post-Procedure: - Patient tolerated the procedure well - He was counseled to seek immediate medical attention if experiences significant bleeding, fevers, or severe pain - Return in one week to discuss biopsy results  Assessment/ Plan: Will follow up in 1-2 weeks to discuss pathology  Dustin Gimenez, MD

## 2024-01-07 NOTE — Patient Instructions (Signed)

## 2024-01-18 ENCOUNTER — Other Ambulatory Visit: Payer: Self-pay

## 2024-01-18 ENCOUNTER — Ambulatory Visit: Admitting: Urology

## 2024-01-18 VITALS — BP 162/84 | HR 60 | Ht 69.0 in | Wt 190.0 lb

## 2024-01-18 DIAGNOSIS — C61 Malignant neoplasm of prostate: Secondary | ICD-10-CM

## 2024-01-18 NOTE — Progress Notes (Signed)
   01/18/2024 2:56 PM   David Kirby [place] May 20, 1956 [id number]  Reason for visit: Follow up prostate biopsy results, prostate cancer on active surveillance  HPI: Healthy 68 year old male who has previously been followed by Dr. Penne for low risk prostate cancer on active surveillance.  I reviewed her prior notes extensively.  He was originally of evaluated for a elevated PSA of 5 in 2020 and prostate MRI ordered showing a PI-RADS 3 lesion.  Biopsy in June 2021 showed 1/12 cores showing grade group 1, 3+3= 6 low risk disease in the right mid gland, prostate volume was 80 g.  He has had a steady increase in his PSA up to 10.1 that decreased to 8.3 on repeat.  Repeat prostate MRI was ordered in September 2024 showing a 81 g prostate, stable PI-RADS 3 lesion right posterior lateral peripheral zone at the apex.  He underwent repeat biopsy with Dr. Penne on 01/07/2024, 3 biopsies taken from ROI #1 and 12 standard biopsies.  ROI biopsy was benign, 2/12 cores of template biopsy showed 1 core of 3+3=6 with 1% involvement, and 1 core of 3+4=7 involvement with 4% max core involvement.  They were unable to determine the percentage of pattern for involvement based on the small volume of disease.  We reviewed the risk classifications per the AUA guidelines including very low risk, low risk, intermediate risk, and high risk disease, and the need for additional staging imaging with CT and bone scan in patients with unfavorable intermediate risk and high risk disease.  I explained that his life expectancy, clinical stage, Gleason score, PSA, and other co-morbidities influence treatment strategies.  We discussed the roles of active surveillance, radiation therapy, surgical therapy with robotic prostatectomy, and hormone therapy with androgen deprivation.  We discussed that patients urinary symptoms also impact treatment strategy, as patients with severe lower urinary tract symptoms may have significant worsening or  even develop urinary retention after undergoing radiation.  In summary, [name] is a 68 y.o. man with low volume favorable intermediate risk prostate cancer. He would like to continue active surveillance which I think is very reasonable.  RTC PHI score 6 months, PVR   [name], MD  [facility] [address] [place], [place] [address] [phone]

## 2024-01-18 NOTE — Patient Instructions (Addendum)
 Your biopsy showed primarily low risk prostate cancer.  There is a very small amount (<4%) in 1 out of 12 cores showing possible intermediate favorable risk disease.  This can also very safely be monitored, and has very low risk of causing symptoms or spreading outside the prostate in the next 15 to 20 years.  Plan to follow-up in 6 months with a PSA, please plan to stop by [facility] here in [place] located on the 2nd floor in [address] to have your lab work done prior to your appointment

## 2024-07-25 ENCOUNTER — Ambulatory Visit: Admitting: Urology

## 2024-07-28 ENCOUNTER — Other Ambulatory Visit

## 2024-07-28 DIAGNOSIS — C61 Malignant neoplasm of prostate: Secondary | ICD-10-CM

## 2024-07-31 LAB — REFLEX INFORMATION

## 2024-07-31 LAB — PROSTATE HEALTH INDEX: Prostate Specific Ag: 11.7 ng/mL — ABNORMAL HIGH (ref 0.0–3.9)

## 2024-08-10 ENCOUNTER — Ambulatory Visit: Admitting: Urology

## 2024-08-10 VITALS — BP 144/87 | HR 80 | Ht 69.0 in | Wt 185.0 lb

## 2024-08-10 DIAGNOSIS — C61 Malignant neoplasm of prostate: Secondary | ICD-10-CM

## 2024-08-10 NOTE — Progress Notes (Signed)
" ° °  08/10/2024 3:27 PM   David Kirby Ada [birth year] [id number]  Reason for visit: Follow up prostate cancer on active surveillance  HPI: Healthy 69 year old male who has previously been followed by Dr. Penne for low risk prostate cancer on active surveillance.  I reviewed her prior notes extensively.  He was originally of evaluated for a elevated PSA of 5 in 2020 and prostate MRI ordered showing a PI-RADS 3 lesion.  Biopsy in June 2021 showed 1/12 cores showing grade group 1, 3+3= 6 low risk disease in the right mid gland, prostate volume was 80 g.  He has had a steady increase in his PSA up to 10.1 that decreased to 8.3 on repeat.  Repeat prostate MRI was ordered in September 2024 showing a 81 g prostate, stable PI-RADS 3 lesion right posterior lateral peripheral zone at the apex.  He underwent repeat biopsy with Dr. Penne on 01/07/2024, 3 biopsies taken from ROI #1 and 12 standard biopsies.  ROI biopsy was benign, 2/12 cores of template biopsy showed 1 core of 3+3=6 with 1% involvement, and 1 core of 3+4=7 involvement with 4% max core involvement.  They were unable to determine the percentage of pattern for involvement based on the small volume of disease.  He opted to continue active surveillance.  Most recent PSA back up to 11.7, which though increased from prior, stable from value of 10.3 and March 2025.  We again reviewed options including definitive treatment and he strongly prefers active surveillance.  Unfortunately his wife recently passed away in the last few months from cancer.  I did recommend a GPS score for further restratification, and he was in agreement.  GPS score, call with results, RTC PSA 6 months for ongoing active surveillance  David JAYSON Burnet, MD  [facility] [address] [place], [place] [address] [phone]   "

## 2025-02-07 ENCOUNTER — Other Ambulatory Visit

## 2025-02-14 ENCOUNTER — Ambulatory Visit: Admitting: Urology
